# Patient Record
Sex: Female | Born: 1950 | Race: Black or African American | Hispanic: No | Smoking: Never smoker
Health system: Southern US, Community
[De-identification: ages and names within clinical notes are randomized; demographics above are authoritative.]

## PROBLEM LIST (undated history)

## (undated) DIAGNOSIS — K589 Irritable bowel syndrome without diarrhea: Secondary | ICD-10-CM

## (undated) DIAGNOSIS — K5790 Diverticulosis of intestine, part unspecified, without perforation or abscess without bleeding: Secondary | ICD-10-CM

## (undated) DIAGNOSIS — F419 Anxiety disorder, unspecified: Secondary | ICD-10-CM

## (undated) DIAGNOSIS — K219 Gastro-esophageal reflux disease without esophagitis: Secondary | ICD-10-CM

## (undated) DIAGNOSIS — IMO0002 Reserved for concepts with insufficient information to code with codable children: Secondary | ICD-10-CM

## (undated) HISTORY — PX: CARDIAC SURGERY: SHX584

## (undated) HISTORY — PX: ABDOMINAL HYSTERECTOMY: SHX81

## (undated) HISTORY — PX: PARTIAL HYSTERECTOMY: SHX80

## (undated) HISTORY — PX: UMBILICAL HERNIA REPAIR: SHX196

## (undated) HISTORY — DX: Anxiety disorder, unspecified: F41.9

## (undated) HISTORY — DX: Irritable bowel syndrome, unspecified: K58.9

## (undated) HISTORY — PX: OTHER SURGICAL HISTORY: SHX169

## (undated) HISTORY — DX: Reserved for concepts with insufficient information to code with codable children: IMO0002

## (undated) HISTORY — DX: Diverticulosis of intestine, part unspecified, without perforation or abscess without bleeding: K57.90

## (undated) HISTORY — PX: TUBAL LIGATION: SHX77

## (undated) HISTORY — DX: Gastro-esophageal reflux disease without esophagitis: K21.9

---

## 2002-07-04 ENCOUNTER — Ambulatory Visit (HOSPITAL_COMMUNITY): Admission: RE | Admit: 2002-07-04 | Discharge: 2002-07-04 | Payer: Self-pay | Admitting: Internal Medicine

## 2002-07-04 ENCOUNTER — Encounter: Payer: Self-pay | Admitting: Internal Medicine

## 2008-01-16 ENCOUNTER — Ambulatory Visit (HOSPITAL_COMMUNITY): Admission: RE | Admit: 2008-01-16 | Discharge: 2008-01-16 | Payer: Self-pay | Admitting: Family Medicine

## 2008-01-25 ENCOUNTER — Ambulatory Visit: Payer: Self-pay | Admitting: Gastroenterology

## 2008-02-12 ENCOUNTER — Ambulatory Visit: Payer: Self-pay | Admitting: Gastroenterology

## 2008-02-12 ENCOUNTER — Encounter: Payer: Self-pay | Admitting: Gastroenterology

## 2008-02-12 ENCOUNTER — Ambulatory Visit (HOSPITAL_COMMUNITY): Admission: RE | Admit: 2008-02-12 | Discharge: 2008-02-12 | Payer: Self-pay | Admitting: Gastroenterology

## 2008-02-12 HISTORY — PX: ESOPHAGOGASTRODUODENOSCOPY: SHX1529

## 2008-02-12 HISTORY — PX: COLONOSCOPY: SHX174

## 2008-03-28 ENCOUNTER — Ambulatory Visit: Payer: Self-pay | Admitting: Gastroenterology

## 2008-08-22 DIAGNOSIS — K219 Gastro-esophageal reflux disease without esophagitis: Secondary | ICD-10-CM | POA: Insufficient documentation

## 2008-08-22 DIAGNOSIS — R109 Unspecified abdominal pain: Secondary | ICD-10-CM | POA: Insufficient documentation

## 2008-09-24 ENCOUNTER — Ambulatory Visit: Payer: Self-pay | Admitting: Gastroenterology

## 2008-09-24 DIAGNOSIS — M25559 Pain in unspecified hip: Secondary | ICD-10-CM | POA: Insufficient documentation

## 2008-10-22 ENCOUNTER — Ambulatory Visit (HOSPITAL_COMMUNITY): Admission: RE | Admit: 2008-10-22 | Discharge: 2008-10-22 | Payer: Self-pay | Admitting: Family Medicine

## 2009-06-13 ENCOUNTER — Encounter (INDEPENDENT_AMBULATORY_CARE_PROVIDER_SITE_OTHER): Payer: Self-pay | Admitting: *Deleted

## 2009-06-13 LAB — CONVERTED CEMR LAB
ALT: 11 units/L
AST: 18 units/L
Albumin: 4.2 g/dL
Alkaline Phosphatase: 48 units/L
BUN: 12 mg/dL
Bilirubin, Direct: >60 mg/dL
CO2: 26 meq/L
Calcium: 9.2 mg/dL
Chloride: 107 meq/L
Creatinine, Ser: 0.79 mg/dL
Glomerular Filtration Rate, Af Am: >60 mL/min/{1.73_m2}
Glucose, Bld: 87 mg/dL
Potassium: 4 meq/L
Sodium: 142 meq/L
TSH: 1.214 microintl units/mL
Total Protein: 6.7 g/dL

## 2009-06-23 ENCOUNTER — Encounter (INDEPENDENT_AMBULATORY_CARE_PROVIDER_SITE_OTHER): Payer: Self-pay | Admitting: *Deleted

## 2009-09-24 ENCOUNTER — Encounter (INDEPENDENT_AMBULATORY_CARE_PROVIDER_SITE_OTHER): Payer: Self-pay | Admitting: *Deleted

## 2009-11-18 ENCOUNTER — Ambulatory Visit: Payer: Self-pay | Admitting: Gastroenterology

## 2009-11-18 DIAGNOSIS — R634 Abnormal weight loss: Secondary | ICD-10-CM | POA: Insufficient documentation

## 2009-11-18 DIAGNOSIS — K589 Irritable bowel syndrome without diarrhea: Secondary | ICD-10-CM | POA: Insufficient documentation

## 2009-11-18 DIAGNOSIS — R142 Eructation: Secondary | ICD-10-CM

## 2009-11-18 DIAGNOSIS — R1011 Right upper quadrant pain: Secondary | ICD-10-CM | POA: Insufficient documentation

## 2009-11-18 DIAGNOSIS — R143 Flatulence: Secondary | ICD-10-CM

## 2009-11-18 DIAGNOSIS — R630 Anorexia: Secondary | ICD-10-CM | POA: Insufficient documentation

## 2009-11-18 DIAGNOSIS — R141 Gas pain: Secondary | ICD-10-CM | POA: Insufficient documentation

## 2009-11-21 ENCOUNTER — Encounter: Payer: Self-pay | Admitting: Gastroenterology

## 2009-11-21 ENCOUNTER — Ambulatory Visit (HOSPITAL_COMMUNITY): Admission: RE | Admit: 2009-11-21 | Discharge: 2009-11-21 | Payer: Self-pay | Admitting: Gastroenterology

## 2009-11-26 ENCOUNTER — Encounter (INDEPENDENT_AMBULATORY_CARE_PROVIDER_SITE_OTHER): Payer: Self-pay

## 2009-12-15 ENCOUNTER — Telehealth (INDEPENDENT_AMBULATORY_CARE_PROVIDER_SITE_OTHER): Payer: Self-pay

## 2010-01-08 LAB — CONVERTED CEMR LAB
ALT: 12 units/L (ref 0–35)
AST: 19 units/L (ref 0–37)
Albumin: 4 g/dL (ref 3.5–5.2)
Alkaline Phosphatase: 51 units/L (ref 39–117)
BUN: 13 mg/dL (ref 6–23)
Basophils Absolute: 0 10*3/uL (ref 0.0–0.1)
Basophils Relative: 1 % (ref 0–1)
CO2: 26 meq/L (ref 19–32)
Calcium: 9.7 mg/dL (ref 8.4–10.5)
Chloride: 103 meq/L (ref 96–112)
Creatinine, Ser: 0.97 mg/dL (ref 0.40–1.20)
Eosinophils Absolute: 0.1 10*3/uL (ref 0.0–0.7)
Eosinophils Relative: 3 % (ref 0–5)
Glucose, Bld: 90 mg/dL (ref 70–99)
HCT: 41.5 % (ref 36.0–46.0)
Hemoglobin: 13.4 g/dL (ref 12.0–15.0)
Lipase: 29 units/L (ref 0–75)
Lymphocytes Relative: 40 % (ref 12–46)
Lymphs Abs: 1.5 10*3/uL (ref 0.7–4.0)
MCHC: 32.3 g/dL (ref 30.0–36.0)
MCV: 88.7 fL (ref 78.0–100.0)
Monocytes Absolute: 0.3 10*3/uL (ref 0.1–1.0)
Monocytes Relative: 8 % (ref 3–12)
Neutro Abs: 1.8 10*3/uL (ref 1.7–7.7)
Neutrophils Relative %: 48 % (ref 43–77)
Platelets: 195 10*3/uL (ref 150–400)
Potassium: 4.2 meq/L (ref 3.5–5.3)
RBC: 4.68 M/uL (ref 3.87–5.11)
RDW: 13.6 % (ref 11.5–15.5)
Sodium: 142 meq/L (ref 135–145)
Total Bilirubin: 0.6 mg/dL (ref 0.3–1.2)
Total Protein: 6.5 g/dL (ref 6.0–8.3)
WBC: 3.7 10*3/uL — ABNORMAL LOW (ref 4.0–10.5)

## 2010-02-12 ENCOUNTER — Ambulatory Visit: Payer: Self-pay | Admitting: Gastroenterology

## 2010-03-26 ENCOUNTER — Encounter (INDEPENDENT_AMBULATORY_CARE_PROVIDER_SITE_OTHER): Payer: Self-pay | Admitting: *Deleted

## 2010-04-21 ENCOUNTER — Ambulatory Visit (HOSPITAL_COMMUNITY): Admission: RE | Admit: 2010-04-21 | Payer: Self-pay | Admitting: Family Medicine

## 2010-06-13 ENCOUNTER — Encounter: Payer: Self-pay | Admitting: Family Medicine

## 2010-06-23 NOTE — Letter (Signed)
Summary: Recall Office Visit  [facility]  [address]   [place], [place] [address]   Phone: [phone]  Fax: [phone]      March 26, 2010   [facility] [address] [place], [place]  [address] 1950-09-02   Dear Ms. Picinich,   According to our records, it is time for you to schedule a follow-up office visit with [place].   At your convenience, please call [phone] to schedule an office visit. If you have any questions, concerns, or feel that this letter is in error, we would appreciate your call.   Sincerely,    Diana Eves  [facility] Ph: [phone]   Fax: [phone]

## 2010-06-23 NOTE — Assessment & Plan Note (Signed)
Summary: YR FU/ABD PAIN/SS   Visit Type:  f/u Primary Care Provider:  [facility]  Chief Complaint:  abd pain and no appetite.  History of Present Illness: Patient here for one year f/u. H/O GERD/IBS. She c/o ongoing lower abd pain. She is constantly sore. No longer on Hyomax. Last week she started having RUQ abd pain. Pain radiates to back. Intermittent. No n/v. No appetite for several months. In AM, wake up with bitter taste in mouth. PP abd bloating. No heartburn. BM 2-3 times per day. No melena, brbpr. Weight down six pounds. No pp nausea, just no appetite. PCP recently gave her RX for carafate. No improvement of symptoms. Also given omeprazole 40mg  daily. Apparently patient came off med since last time here. She doesn't recall being on it before, but she was on both carafate and omeprazole previously when she had EGD. Denies melena, brbpr.   Current Medications (verified): 1)  Omeprazole 40 Mg Cpdr (Omeprazole) .... Once Daily 2)  Carafate 1 Gm Tabs (Sucralfate) .... Qid 3)  Xanax 0.5 Mg Tabs (Alprazolam) .... At Bedtime  Allergies (verified): 1)  ! Penicillin  Review of Systems      See HPI  Vital Signs:  Patient profile:   60 year old female Height:      66 inches Weight:      160 pounds BMI:     25.92 Temp:     97.9 degrees F oral Pulse rate:   76 / minute BP sitting:   122 / 80  (left arm) Cuff size:   regular  Vitals Entered By: Hendricks Limes LPN (November 18, 2009 9:14 AM)  Physical Exam  General:  Well developed, well nourished, no acute distress. Head:  Normocephalic and atraumatic. Eyes:  sclera nonicteric Mouth:  OP moist Lungs:  Clear throughout to auscultation. Heart:  Regular rate and rhythm; no murmurs, rubs,  or bruits. Abdomen:  Soft. Mild RUQ tenderness. No HSM or masses. No abd bruit or hernia. Normal BS. Extremities:  No clubbing, cyanosis, edema or deformities noted. Neurologic:  Alert and  oriented x4;  grossly normal  neurologically. Skin:  Intact without significant lesions or rashes. Psych:  Alert and cooperative. Normal mood and affect.  Impression & Recommendations:  Problem # 1:  RUQ PAIN (ICD-789.01) RUQ abd pain, anorexia, weight loss, pp abd bloating. Recently started back on PPI and Carafate. Prior h/o gastritis and two 1mm gastric ulcers. No evidence of malignancy or H.Pylori. Currently symptoms may be secondary to refractory GERD, gastritis. Need to consider biliary etiology. No improvement with Carafate, so she was asked to stop. She will increase omeprazole to two times a day for eight weeks. Add Sustenex for bloating. Further recommendations to follow.  Orders: T-CBC w/Diff [phone]) T-Comprehensive Metabolic Panel [phone]) T-Lipase [phone]) Est. Patient Level III (57846) Abd U/S  Problem # 2:  IBS (ICD-564.1)  May be source for pp bloating. BM ok for now. Plan as above.   Orders: Est. Patient Level III ([address])  Patient Instructions: 1)  Have labwork and Abd U/S done. 2)  Begin Sustenex, one by mouth daily for four weeks to help with abdominal bloating. 3)  Increase your omeprazole to two times a day for next two months. New RX given.  4)  Stop Carafate.  5)  The medication list was reviewed and reconciled.  All changed / newly prescribed medications were explained.  A complete medication list was provided to the patient / caregiver. Prescriptions: OMEPRAZOLE 40 MG CPDR (OMEPRAZOLE)  one by mouth 30 mins before breakfast and evening meal for next 60 days. Then go back to once daily  #60 x 1   Entered and Authorized by:   Leanna Battles. Dixon Boos   Signed by:   Leanna Battles Pooja Camuso PA-C on 11/18/2009   Method used:   Print then Give to Patient   RxID:   [id number]

## 2010-06-23 NOTE — Assessment & Plan Note (Signed)
Summary: RUQ pain   Visit Type:  Follow-up Visit Primary Care Provider:  [facility]: Nelva Bush, M.D.  Chief Complaint:  RUQ pain.  History of Present Illness: Feels like she's going to die. So much pain in RUQ and RLQ and posterior right hip. If lay on left side it throbs on right side. Took Sustenex and it improved Sx. Carafate did not help. AVoids food that cause heartburn. Sx: 1-2x/month. Sharp: there all the time. Can keep her awake for the past 2 weeks. BMs: 2x/day: soft. No nausea or vomiting, Pain not related to eating. No problems swallowing. No blod in her stool. No ASA, BC, Goody's. Take Advil: 1-2x/week. No Motrin or Aleve, No EtOH or cigs. No OTC or herbal meds. Not drinking water: 1 bottle a day. Fiber: eats fiber bars once daily. Stress makes RUQ worse, no change with changing positions. JOB: was working 7 days a week: 2nd shift and now working 2 on 3 off, 3 on 2 off (12 hr shifts). No problems with constipation-eats lots of fruit.  Preventive Screening-Counseling & Management  Alcohol-Tobacco     Smoking Status: never  Current Medications (verified): 1)  Omeprazole 40 Mg Cpdr (Omeprazole) .... One By Mouth 30 Mins Before Breakfast and Evening Meal For Next 60 Days. Then Go Back To Once Daily 2)  Xanax 0.5 Mg Tabs (Alprazolam) .... At Bedtime  Allergies (verified): 1)  ! Penicillin  Past History:  Past Medical History: IBS Epigastric pain/GERD LLQ pain **TCS/EGD 2009 Mild AC diverticulosis. SIMPLE ADENOMA. Small internal hemorrhoids. Moderate-sized HH Mild gastritis and duodenitis. Prominent ampulla.   Past Surgical History: Reviewed history from 08/22/2008 and no changes required. Umbilical Hernia Repair Hysterectomy Removal of Thyroid Growth  Family History: No FH of Colon Cancer OR POLYPS  Social History: Divorced. 4 kids: youngest 97 Alcohol Use - no Occupation: Chief Executive Officer goes to work, church, and home. Patient has never smoked.  Smoking  Status:  never  Vital Signs:  Patient profile:   60 year old female Height:      66 inches Weight:      163 pounds BMI:     26.40 Temp:     98.2 degrees F oral Pulse rate:   76 / minute BP sitting:   110 / 70  (left arm) Cuff size:   regular  Vitals Entered By: Cloria Spring LPN (February 12, 2010 3:32 PM)  Physical Exam  General:  Well developed, well nourished, no acute distress. Head:  Normocephalic and atraumatic. Eyes:  PERRL, no icterus. Mouth:  No deformity or lesions. Lungs:  Clear throughout to auscultation. Heart:  Regular rate and rhythm; no murmurs. Abdomen:  Soft, mild TTP in RUQ, nondistended.  Normal bowel sounds. Pos Carnett's sign in RLQ.  Impression & Recommendations:  Problem # 1:  RUQ PAIN (ICD-789.01) 2o to functional abd pain. Add IMP. OPV in 2 mos. RLQ and pos r hip pain 2o to musculoskeletal pain. Consider PT or chiropracter.   CC: PCP  Patient Instructions: 1)  ADD IMIPRAMINE AT BEDTIME. 2)  Side effects: dry mouth, dry eyes, urinary retention, constipation, drowsiness. 3)  Continue fiber and water. 4)  Contiue OMP.  5)  Follow up in 2 mos. 6)  The medication list was reviewed and reconciled.  All changed / newly prescribed medications were explained.  A complete medication list was provided to the patient / caregiver. Prescriptions: IMIPRAMINE HCL 10 MG TABS (IMIPRAMINE HCL) 1 by mouth at bedtime for 3 days then 2  by mouth at bedtime for 3 days then 3 by mouth qhs  #90 x 5   Entered and Authorized by:   West Bali MD   Signed by:   West Bali MD on 02/12/2010   Method used:   Electronically to        [facility]* (retail)       [address]       [place], [place]  [address]       Ph: [phone]       Fax: [phone]   RxID:   [id number]   Appended Document: RUQ pain F/U OPV IS IN THE COMPUTER  Appended Document: Orders Update    Clinical Lists Changes  Orders: Added new Service order of Est.  Patient Level III (72536) - Signed

## 2010-06-23 NOTE — Letter (Signed)
Summary: Recall Office Visit  [facility]  [address]   [place], [place] [address]   Phone: [phone]  Fax: [phone]      Sep 24, 2009   [facility] [address] [place], [place]  [address] 03/01/1951   Dear Ms. Winemiller,   According to our records, it is time for you to schedule a follow-up office visit with [place].   At your convenience, please call [phone] to schedule an office visit. If you have any questions, concerns, or feel that this letter is in error, we would appreciate your call.   Sincerely,    Diana Eves  [facility] Ph: [phone]   Fax: [phone]

## 2010-06-23 NOTE — Assessment & Plan Note (Signed)
Summary: FU OV 4 MO,ABD PAIN,HEMORRHOIDS/AMS   Visit Type:  Follow-up Visit Primary Care Provider:  Earma Reading, NP-C  Chief Complaint:  follow up- still having pain in lower abd.  History of Present Illness: Pt last seen 11/09. Still discomfort in LLQ and to back. Last week it was in left butt cheek and working 12 hour shifts. No rectal bleeding. Appetetie changed.  Everything tastes bitter. Take Xanax priro to bed. No new meds or supplements. Take Omeprazole as needed. No ibuprofen. Only has heartburn if eats the wrong thing: tomatoes, OJ. Lost 5 lbs. BM 2x/day. Pain in Left side better after BMs. Drinks very little water, but eats fiber. Used Hyomax and made pain better.  Current Medications (verified): 1)  Omeprazole 20 Mg Cpdr (Omeprazole) .... As Needed  Allergies (verified): 1)  ! Penicillin  Past History:  Past Medical History:    IBS    Epigastric pain    LLQ pain  Vital Signs:  Patient profile:   60 year old female Height:      66 inches Weight:      166 pounds BMI:     26.89 Temp:     98.0 degrees F oral Pulse rate:   80 / minute BP sitting:   122 / 88  (right arm) Cuff size:   regular  Vitals Entered By: Hendricks Limes LPN (Sep 24, 452 8:53 AM)  Physical Exam  General:  Well developed, well nourished, no acute distress. Head:  Normocephalic and atraumatic. Lungs:  Clear throughout to auscultation. Heart:  Regular rate and rhythm; no murmurs, rubs,  or bruits. Abdomen:  Soft, nontender and nondistended.Normal bowel sounds.  Impression & Recommendations:  Problem # 1:  ABDOMINAL PAIN, CHRONIC (ICD-789.00) Assessment Improved Likely secondary to IBS. Continue Levsin as needed. RPV in 12 mos.  CC: Earma Reading  Problem # 2:  HIP PAIN, LEFT (ICD-719.45) Likely secondary to SI joint inflammation. Ibuprofen sparingly. HEAT three times a day. Shock absorbing shoes.  Appended Document: FU OV 4 MO,ABD PAIN,HEMORRHOIDS/AMS Discussed IBS and management. 6-8 cups  water daily. High fiber diet.  Appended Document: Orders Update-charge    Clinical Lists Changes  Orders: Added new Service order of Est. Patient Level III (09811) - Signed

## 2010-06-23 NOTE — Letter (Signed)
Summary: Normal Results Letter  [facility]  [address]   [place], [place] [address]   Phone: [phone]  Fax: [phone]    November 26, 2009  [facility] [address] [place], [place]  [address] Sep 24, 1950   Dear Krystal Morris,   Our office has been trying to contact you.  We just wanted to inform you that all of your blood tests were normal. You will need a follow up office visit with Dr. Darrick Penna in 6-8 weeks. Please call our office to schedule this. If you have any questions, please call the office at [phone].   Thank you,    Krystal Limes, LPN Cloria Spring, LPN  [facility] Ph: [phone]   Fax: [phone]

## 2010-06-23 NOTE — Progress Notes (Signed)
Summary: phone note/ bitter taste  Phone Note Call from Patient   Caller: Patient Summary of Call: Pt called and said she has been having a bitter taste in her mouth after her meds at night. She takes Omeprazole and Xanax at bedtime, said the bitter taste seems to give her a headache, please advise! Initial call taken by: Cloria Spring LPN,  December 15, 2009 3:02 PM     Appended Document: phone note/ bitter taste Please call pt. She should continue omeprazole to 20 mg 30 minutes prior to meals. She should follow a low fat diet. Use Maalox or Mylanta 20 cc at bedtime.  Appended Document: phone note/ bitter taste Pt informed.

## 2010-06-23 NOTE — Miscellaneous (Signed)
Summary: LABS CMP,TSH ,06/13/2009  Clinical Lists Changes  Observations: Added new observation of CALCIUM: 9.2 mg/dL (81/19/1478 29:56) Added new observation of ALBUMIN: 4.2 g/dL (21/30/8657 84:69) Added new observation of PROTEIN, TOT: 6.7 g/dL (62/95/2841 32:44) Added new observation of SGPT (ALT): 11 units/L (06/13/2009 15:58) Added new observation of SGOT (AST): 18 units/L (06/13/2009 15:58) Added new observation of ALK PHOS: 48 units/L (06/13/2009 15:58) Added new observation of BILI DIRECT: >60 mg/dL (05/26/7251 66:44) Added new observation of GFR AA: >60 mL/min/1.25m2 (06/13/2009 15:58) Added new observation of CREATININE: 0.79 mg/dL (03/47/4259 56:38) Added new observation of BUN: 12 mg/dL (75/64/3329 51:88) Added new observation of BG RANDOM: 87 mg/dL (41/66/0630 16:01) Added new observation of CO2 PLSM/SER: 26 meq/L (06/13/2009 15:58) Added new observation of CL SERUM: 107 meq/L (06/13/2009 15:58) Added new observation of K SERUM: 4.0 meq/L (06/13/2009 15:58) Added new observation of NA: 142 meq/L (06/13/2009 15:58) Added new observation of TSH: 1.214 microintl units/mL (06/13/2009 15:58)

## 2010-09-04 ENCOUNTER — Other Ambulatory Visit (HOSPITAL_COMMUNITY): Payer: Self-pay | Admitting: Internal Medicine

## 2010-09-04 DIAGNOSIS — Z139 Encounter for screening, unspecified: Secondary | ICD-10-CM

## 2010-09-10 ENCOUNTER — Ambulatory Visit (HOSPITAL_COMMUNITY): Payer: Self-pay

## 2010-09-17 ENCOUNTER — Ambulatory Visit (HOSPITAL_COMMUNITY)
Admission: RE | Admit: 2010-09-17 | Discharge: 2010-09-17 | Disposition: A | Discharge status: Home / Self Care | Payer: PRIVATE HEALTH INSURANCE | Source: Ambulatory Visit | Attending: Internal Medicine | Admitting: Internal Medicine

## 2010-09-17 DIAGNOSIS — Z139 Encounter for screening, unspecified: Secondary | ICD-10-CM

## 2010-09-17 DIAGNOSIS — Z1231 Encounter for screening mammogram for malignant neoplasm of breast: Secondary | ICD-10-CM | POA: Insufficient documentation

## 2010-09-23 ENCOUNTER — Ambulatory Visit: Payer: Self-pay | Admitting: Gastroenterology

## 2010-09-28 ENCOUNTER — Encounter: Payer: Self-pay | Admitting: Gastroenterology

## 2010-10-06 NOTE — Assessment & Plan Note (Signed)
NAME:  Krystal Morris, Krystal Morris                CHART#:  [id number]   DATE:  01/25/2008                       DOB:  Aug 18, 1950   REASON FOR CONSULTATION:  Abdominal pain.   PHYSICIAN REQUESTING CONSULTATION:  Patrica Duel, MD   HISTORY OF PRESENT ILLNESS:  The patient is a pleasant 60 year old lady  who presents for further evaluation of chronic abdominal pain.  She has  had this pain for at least over 1 year.  She states her abdomen feels  sore all the time.  She particularly notes lower abdominal pain which  tend to get worse with increased stress or when she goes from a sitting  to a standing position.  She complains of abdominal bloating and  swelling.  She has a bowel movement generally every 2-3 days.  She has  occasional bright red blood per rectum.  She notes her heartburn is  better on omeprazole.  She has no nausea or vomiting.  No fever or  chills.  Her weight is down 6 pounds in the last 2 weeks and she states  this is because she does not feel like eating.  She has intermittent  diarrhea which occurs sporadically.  She had an abdominal ultrasound on  January 16, 2008, which was normal.  She apparently had a CT last year.  We are trying to get those records.  She recalls her last colonoscopy to  be about 6 years ago with Dr. Cleotis Nipper and states it was normal.  Her  last EGD was in the very remote past.  She has had recent blood work.  We trying to get those records as well.   CURRENT MEDICATIONS:  1. Carafate 2 teaspoons before meals and at bedtime.  2. Omeprazole 20 mg daily.  3. Amitriptyline 10-20 mg at bedtime.  4. Ibuprofen p.r.n., but does not take regularly.   ALLERGIES:  Penicillin causes rash.   PAST MEDICAL HISTORY:  She has chronic GERD and chronic abdominal pain  as above.   PAST SURGICAL HISTORY:  Colonoscopy as above, umbilical hernia repair,  and hysterectomy.  She had lysis of adhesions and removal of a thyroid  growth.   FAMILY HISTORY:  Negative for  colorectal cancer.  Her mother and father  both had emphysema, brother deceased due to cancer of ? Back.   SOCIAL HISTORY:  She is separated.  She has 4 children.  She is employed  as a Location manager.  She has never been a smoker.  No alcohol or drug  use.   REVIEW OF SYSTEMS:  See HPI for GI.  Constitutional:  Weight loss as  above.  Cardiopulmonary:  No chest pain or shortness of breath.  Genitourinary:  No dysuria or hematuria.   PHYSICAL EXAMINATION:  VITAL SIGNS:  Weight 172, height 5 feet 7 inches,  temp 98, blood pressure 122/82, and pulse 60.  GENERAL:  A pleasant, well-nourished, well-developed female in no acute  distress.  SKIN:  Warm and dry.  No jaundice.  HEENT:  Sclerae nonicteric.  Oropharyngeal mucosa moist and pink.  No  lesions, erythema, or exudate.  No lymphadenopathy or thyromegaly.  CHEST:  Lungs are clear to auscultation.  Cardiac:  Regular rate and rhythm.  No murmurs, rubs, or gallops.  ABDOMEN:  Positive bowel sounds.  Abdomen is soft and nondistended.  She  has a mild diffuse abdominal tenderness with some increased tenderness  in the hypogastrium.  No rebound or guarding.  No organomegaly or  masses.  No abdominal bruits or hernias.  LOWER EXTREMITIES:  No edema.   IMPRESSION:  The patient is a 60 year old lady with chronic abdominal  pain.  She has significant pain in the lower abdomen worse with stress  and when she stands up from the sitting position.  It tends to be worse  with bowel movements.  She also complains of pain just above the  umbilicus.  Her heartburn is better on omeprazole.  She has intermittent  hematochezia with last colonoscopy about 6 years ago.  Differential  diagnosis for abdominal pain includes adhesions, less likely peptic  ulcer disease, IBS, or functional abdominal pain.   PLAN:  1. Retrieve record regarding last colonoscopy.  2. Retrieve recent labs and CT done about a year ago.  3. Trial of Levsin sublingual q.a.c.,  at bedtime, p.r.n., abdominal      cramping, #60, 1 refill.  4. Further recommendations to follow regarding if she need to have      endoscopy, colonoscopy, etc., done.       Tana Coast, P.A.  Electronically Signed     Kassie Mends, M.D.  Electronically Signed    LL/MEDQ  D:  01/25/2008  T:  01/25/2008  Job:  [id number]   cc:   Patrica Duel, M.D.

## 2010-10-06 NOTE — Assessment & Plan Note (Signed)
NAMESHACONDA, HAJDUK                CHART#:  [id number]   DATE:  03/28/2008                       DOB:  [birth year]   REFERRING Lelania Bia:  Patrica Duel, MD.   PROBLEM LIST:  1. Abdominal pain.  2. Gastroesophageal reflux disease.  3. Umbilical hernia repair.  4. Lysis of adhesions.  5. Thyroidectomy.  6. Hysterectomy.   SUBJECTIVE:  The patient is a 60 year old female who presents as a  return patient visit.  She was doing fairly well until she started  taking ibuprofen on an empty stomach.  She was taking 2 over-the-counter  everyday for bad headaches.  She was complaining of decreased appetite.  She has been put fiber in her diet.  She is sleeping very well.  The  amitriptyline causes mild dry eyes.  She was stressed out, but now she  is better.   MEDICATIONS:  1. Carafate.  2. Omeprazole.  3. Amitriptyline 2 nightly.  4. Ibuprofen as needed.   OBJECTIVE/PHYSICAL EXAM:  VITAL SIGNS:  Weight 171 pounds, height 5 feet  7 inches, temperature 98, blood pressure 124/80, and pulse 80.  GENERAL:  She is in no apparent distress.  Alert and oriented x4.LUNGS:  Clear to auscultation bilaterally.  CARDIOVASCULAR:  Regular rhythm.ABDOMEN:  Bowel sounds are present,  soft, nontender, and nondistended.   ASSESSMENT:  The patient is a 60 year old female who is having  epigastric pain likely secondary to nonsteroidal anti-inflammatory drug  gastritis.  When she had her procedures in September 2009, she had  chronic active gastritis and was asked to avoid anti-inflammatory drugs.  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  Thank you for  allowing me to see the patient in consultation.  My recommendations  follow.   RECOMMENDATIONS:  1. She may stop the Carafate.  She should continue the omeprazole and      amitriptyline.  She should stop the ibuprofen and use Tylenol as      needed for pain.  2. She has a follow up appointment to see me in 4 months.  3. Screening colonoscopy  in 2019.       Kassie Mends, M.D.  Electronically Signed     SM/MEDQ  D:  03/28/2008  T:  03/28/2008  Job:  [id number]   cc:   Patrica Duel, M.D.

## 2010-10-06 NOTE — Op Note (Signed)
NAME:  Krystal Morris, Krystal Morris               ACCOUNT NO.:  [id number]   MEDICAL RECORD NO.:  [id number]          PATIENT TYPE:  AMB   LOCATION:  DAY                           FACILITY:  [facility]   PHYSICIAN:  Kassie Mends, M.D.      DATE OF BIRTH:  [birth year]   DATE OF PROCEDURE:  02/12/2008  DATE OF DISCHARGE:                               OPERATIVE REPORT   REFERRING PHYSICIAN:  Patrica Duel, MD   PROCEDURE:  1. Ileocolonoscopy with cold forceps polypectomy.  2. Esophagogastroduodenoscopy with cold forceps biopsies.   INDICATION FOR EXAMINATION:  Krystal Morris is a 60 year old female who  presented with abdominal pain and rectal bleeding.  She also had  unintentional weight loss.  She reports having multiple psychosocial  stressors.  She lost her mother, father, sister, and a brother within  the last 2 years.  Her abdominal pain can be precipitated by stress and  by changes in position.  It is also associated with constipation, which  she describes as passing 2 hard stools daily. She does not drink water  or eat a lot of fiber.   FINDINGS:  1. Normal terminal ileum, approximately 20 cm visualized.  2. Rare ascending colon diverticulosis.  Small internal hemorrhoids.      Otherwise, no masses, inflammatory changes, or AVM seen.  3. 2-3 mm polyps, which were sessile and seen in the descending colon      and sigmoid colon.  The polyps were removed via cold forceps.  4. Normal esophagus without evidence of Barrett, mass, erosion,      ulceration, or stricture.  5. A 2-3 cm hiatal hernia without evidence of Cameron ulcer.  Patchy      erythema in the mid body and antrum of the stomach.  Biopsies      obtained via cold forceps.  She also had two 1-mm shallow gastric      ulcers seen in the antrum.  6. Patchy erythema in the duodenal bulb.  Biopsies obtained via cold      forceps to evaluate for eosinophilic duodenitis.  7. Prominent ampulla without definite evidence of adenomatous changes.  Otherwise, normal second portion of the duodenum.   DIAGNOSES:  1. Mild ascending colon diverticulosis.  2. Small colon polyps.  3. Small internal hemorrhoids.  4. Moderate-sized hiatal hernia.  5. Mild gastritis and duodenitis.  6. Prominent ampulla.   RECOMMENDATIONS:  1. She should continue the omeprazole daily.  She should avoid aspirin      and antiinflammatory drugs for 14 days.  No ibuprofen until February 28, 2008.  2. No anticoagulation for 5 days.  3. She should avoid gastric irritants and follow a high-fiber diet.      She was given a handout on high-fiber diet, gastritis, ulcers,      constipation, polyps, diverticulosis, and hemorrhoids.  4. Screening colonoscopy in 10 years.  5. She should drink 6-8 cups of water daily.  She should add Benefiber      daily.  6. She has a follow up appointment in 6 weeks with Tana [facility]  for      abdominal pain and constipation.  7. She has had an ultrasound in 2009 and a CT scan in 2008.  Would      consider endoscopic ultrasound to evaluate her ampulla if her      abdominal pain persists.   MEDICATIONS:  1. Demerol 100 mg IV.  2. Versed 6 mg IV.   PROCEDURE TECHNIQUE:  Physical exam was performed.  Informed consent was  obtained from the patient after explaining the benefits, risks, and  alternatives of the procedure.  The patient was connected to the monitor  and placed in left lateral position.  Continuous oxygen was provided by  nasal cannula and IV medicine administered through an indwelling  cannula.  After administration of sedation and rectal exam, the  patient's rectum was intubated and the scope was advanced under direct  visualization to the distal terminal ileum.  The scope was removed  slowly by carefully examining the color, texture, anatomy, and integrity  of the mucosa on the way out.   After the colonoscopy, the patient's esophagus was intubated with a  diagnostic gastroscope and the gastroscope was  advanced under direct  visualization to the second portion of the duodenum.  The scope was  removed slowly by carefully examining the color, texture, anatomy, and  integrity of the mucosa on the way out.  The patient was recovered in  Endoscopy and discharged home in satisfactory condition.   PATH:  Simple adenoma. Chronic active gastritis. Nl duodenum. Continue  omeprazole.      Kassie Mends, M.D.  Electronically Signed     SM/MEDQ  D:  02/12/2008  T:  02/13/2008  Job:  [id number]   cc:   Patrica Duel, M.D.  Fax: [phone]

## 2010-10-07 ENCOUNTER — Ambulatory Visit: Payer: Self-pay | Admitting: Gastroenterology

## 2010-10-15 ENCOUNTER — Ambulatory Visit: Payer: PRIVATE HEALTH INSURANCE | Admitting: Gastroenterology

## 2011-10-04 ENCOUNTER — Other Ambulatory Visit (HOSPITAL_COMMUNITY): Payer: Self-pay | Admitting: Family Medicine

## 2011-10-04 DIAGNOSIS — Z139 Encounter for screening, unspecified: Secondary | ICD-10-CM

## 2011-10-19 ENCOUNTER — Ambulatory Visit (HOSPITAL_COMMUNITY): Payer: PRIVATE HEALTH INSURANCE

## 2011-10-22 ENCOUNTER — Ambulatory Visit (HOSPITAL_COMMUNITY)
Admission: RE | Admit: 2011-10-22 | Discharge: 2011-10-22 | Disposition: A | Discharge status: Home / Self Care | Payer: PRIVATE HEALTH INSURANCE | Source: Ambulatory Visit | Attending: Family Medicine | Admitting: Family Medicine

## 2011-10-22 DIAGNOSIS — Z1231 Encounter for screening mammogram for malignant neoplasm of breast: Secondary | ICD-10-CM | POA: Insufficient documentation

## 2011-10-22 DIAGNOSIS — Z139 Encounter for screening, unspecified: Secondary | ICD-10-CM

## 2011-12-27 ENCOUNTER — Ambulatory Visit (INDEPENDENT_AMBULATORY_CARE_PROVIDER_SITE_OTHER): Payer: PRIVATE HEALTH INSURANCE | Admitting: Urgent Care

## 2011-12-27 ENCOUNTER — Encounter: Payer: Self-pay | Admitting: Urgent Care

## 2011-12-27 ENCOUNTER — Other Ambulatory Visit: Payer: Self-pay | Admitting: Gastroenterology

## 2011-12-27 VITALS — BP 113/65 | HR 80 | Temp 98.3°F | Ht 67.0 in | Wt 158.0 lb

## 2011-12-27 DIAGNOSIS — K921 Melena: Secondary | ICD-10-CM

## 2011-12-27 DIAGNOSIS — K648 Other hemorrhoids: Secondary | ICD-10-CM | POA: Insufficient documentation

## 2011-12-27 MED ORDER — PEG-KCL-NACL-NASULF-NA ASC-C 100 G PO SOLR: 1.0000 | Freq: Once | ORAL | Status: DC

## 2011-12-27 NOTE — Assessment & Plan Note (Addendum)
Krystal Morris is a pleasant 61 y.o. female informant history of intermittent hematochezia and abdominal cramps resolved with defecation. Last colonoscopy has been over 3 years ago, therefore she will need further investigation with colonoscopy to differentiate source of her bleeding. Differentials include diverticular bleeding, benign anorectal source, NSAID-induced colopathy or colorectal carcinoma or Polyp.  I have discussed risks & benefits which include, but are not limited to, bleeding, infection, perforation & drug reaction.  The patient agrees with this plan & written consent will be obtained.    No advil, aleve, goodys, bc powders or anti-inflammatories until colonoscopy

## 2011-12-27 NOTE — Progress Notes (Signed)
Primary Care Physician:  Kirk Ruths, MD Primary Gastroenterologist:  Dr. Jonette Eva  Chief Complaint  Patient presents with  . Rectal Bleeding    HPI:  Krystal Morris is a 61 y.o. female here for further evaluation of rectal bleeding.  For the past 4 months she has had intermittent small to moderate volume burgundy blood noticed mixed in her stools. She has not seen any clots. She has noticed some irritation to her anus, but has not appreciated any hemorrhoids. She has bilateral lower quadrant cramps which are relieved with defecation. Pain 6/10 and radiates to her back. She denies any problems with diarrhea or constipation. She denies any GERD symptoms. She denies any nausea, vomiting, dysphagia or odynophagia. Her appetite has been good. Her weight is stable. She has been taking Advil 1-2 daily for headaches. Last colonoscopy was in 2009 and she had ascending colon diverticulosis and a simple adenoma as well as internal hemorrhoids.  Past Medical History  Diagnosis Date  . Irritable bowel syndrome   . GERD (gastroesophageal reflux disease)     Epigastric pain  . Diverticulosis     Mild AC simple adenoma small interal hemorrhoids moderate-sized HH mild gastritis and duodenitis    Past Surgical History  Procedure Date  . Partial hysterectomy   . Thyroid growth     Removal  . Umbilical hernia repair   . Esophagogastroduodenoscopy 02/12/2008    Fields- Nl duodenum/chronic active gastritis  . Colonoscopy 02/12/2008    Fields-Simple adenoma. Small int hemorrhoids    Current Outpatient Prescriptions  Medication Sig Dispense Refill  . amitriptyline (ELAVIL) 25 MG tablet Take 25 mg by mouth at bedtime.      Marland Kitchen omeprazole (PRILOSEC) 40 MG capsule Take 40 mg by mouth daily.          Allergies as of 12/27/2011 - Review Complete 12/27/2011  Allergen Reaction Noted  . Penicillins      Family History  Problem Relation Age of Onset  . Coronary artery disease Son     History     Social History  . Marital Status: Divorced    Spouse Name: N/A    Number of Children: 3  . Years of Education: N/A   Occupational History  . knitter    Social History Main Topics  . Smoking status: Never Smoker   . Smokeless tobacco: Not on file  . Alcohol Use: No  . Drug Use: No  . Sexually Active: Not on file   Other Topics Concern  . Not on file   Social History Narrative   Lost 1 son, 3 living    Review of Systems: Gen: Denies any fever, chills, sweats, anorexia, fatigue, weakness, malaise, weight loss, and sleep disorder CV: Denies chest pain, angina, palpitations, syncope, orthopnea, PND, peripheral edema, and claudication. Resp: Denies dyspnea at rest, dyspnea with exercise, cough, sputum, wheezing, coughing up blood, and pleurisy. GI: Denies vomiting blood, jaundice, and fecal incontinence GU : Denies urinary burning, blood in urine, urinary frequency, urinary hesitancy, nocturnal urination, and urinary incontinence. MS: Denies joint pain, limitation of movement, and swelling, stiffness, low back pain, extremity pain. Denies muscle weakness, cramps, atrophy.  Derm: Denies rash, itching, dry skin, hives, moles, warts, or unhealing ulcers.  Psych: Denies depression, anxiety, memory loss, suicidal ideation, hallucinations, paranoia, and confusion. Heme: Denies bruising and enlarged lymph nodes. Neuro:  Denies any headaches, dizziness, paresthesias. Endo:  Denies any problems with DM, thyroid, adrenal function.  Physical Exam: BP 113/65  Pulse 80  Temp 98.3 F (36.8 C) (Temporal)  Ht 5\' 7"  (1.702 m)  Wt 158 lb (71.668 kg)  BMI 24.75 kg/m2 No LMP recorded. Patient has had a hysterectomy. General:   Alert,  Well-developed, well-nourished, pleasant and cooperative in NAD Head:  Normocephalic and atraumatic. Eyes:  Sclera clear, no icterus.   Conjunctiva pink. Ears:  Normal auditory acuity. Nose:  No deformity, discharge, or lesions. Mouth:  No deformity or  lesions,oropharynx pink & moist. Neck:  Supple; no masses or thyromegaly. Lungs:  Clear throughout to auscultation.   No wheezes, crackles, or rhonchi. No acute distress. Heart:  Regular rate and rhythm; no murmurs, clicks, rubs,  or gallops. Abdomen:  Normal bowel sounds.  No bruits.  Soft, non-tender and non-distended without masses, hepatosplenomegaly or hernias noted.  No guarding or rebound tenderness.   Rectal:  Deferred. Msk:  Symmetrical without gross deformities. Normal posture. Pulses:  Normal pulses noted. Extremities:  No clubbing or edema. Neurologic:  Alert and  oriented x4;  grossly normal neurologically. Skin:  Intact without significant lesions or rashes. Lymph Nodes:  No significant cervical adenopathy. Psych:  Alert and cooperative. Normal mood and affect.

## 2011-12-27 NOTE — Patient Instructions (Addendum)
Colonoscopy w/ Dr Darrick Penna Call or To ER if severe bleeding No advil, aleve, goodys, bc powders or anti-inflammatories until colonoscopy

## 2011-12-27 NOTE — Progress Notes (Signed)
Faxed to PCP

## 2012-01-19 ENCOUNTER — Encounter (HOSPITAL_COMMUNITY): Payer: Self-pay | Admitting: Pharmacy Technician

## 2012-01-28 ENCOUNTER — Encounter (HOSPITAL_COMMUNITY): Payer: Self-pay | Admitting: *Deleted

## 2012-01-28 ENCOUNTER — Encounter (HOSPITAL_COMMUNITY)
Admission: RE | Disposition: A | Discharge status: Home / Self Care | Payer: Self-pay | Source: Ambulatory Visit | Attending: Gastroenterology

## 2012-01-28 ENCOUNTER — Ambulatory Visit (HOSPITAL_COMMUNITY)
Admission: RE | Admit: 2012-01-28 | Discharge: 2012-01-28 | Disposition: A | Discharge status: Home / Self Care | Payer: PRIVATE HEALTH INSURANCE | Source: Ambulatory Visit | Attending: Gastroenterology | Admitting: Gastroenterology

## 2012-01-28 DIAGNOSIS — K5730 Diverticulosis of large intestine without perforation or abscess without bleeding: Secondary | ICD-10-CM | POA: Insufficient documentation

## 2012-01-28 DIAGNOSIS — K921 Melena: Secondary | ICD-10-CM

## 2012-01-28 DIAGNOSIS — K648 Other hemorrhoids: Secondary | ICD-10-CM

## 2012-01-28 DIAGNOSIS — K625 Hemorrhage of anus and rectum: Secondary | ICD-10-CM

## 2012-01-28 DIAGNOSIS — K644 Residual hemorrhoidal skin tags: Secondary | ICD-10-CM | POA: Insufficient documentation

## 2012-01-28 DIAGNOSIS — Z538 Procedure and treatment not carried out for other reasons: Secondary | ICD-10-CM | POA: Insufficient documentation

## 2012-01-28 HISTORY — PX: COLONOSCOPY: SHX5424

## 2012-01-28 SURGERY — COLONOSCOPY
Anesthesia: Moderate Sedation

## 2012-01-28 MED ORDER — MIDAZOLAM HCL 5 MG/5ML IJ SOLN
INTRAMUSCULAR | Status: AC
  Filled 2012-01-28: qty 10

## 2012-01-28 MED ORDER — STERILE WATER FOR IRRIGATION IR SOLN
Status: DC | PRN
  Administered 2012-01-28: 11:00:00

## 2012-01-28 MED ORDER — MEPERIDINE HCL 100 MG/ML IJ SOLN
INTRAMUSCULAR | Status: AC
  Filled 2012-01-28: qty 2

## 2012-01-28 MED ORDER — MIDAZOLAM HCL 5 MG/5ML IJ SOLN
INTRAMUSCULAR | Status: DC | PRN
  Administered 2012-01-28 (×2): 2 mg via INTRAVENOUS

## 2012-01-28 MED ORDER — SODIUM CHLORIDE 0.45 % IV SOLN
Freq: Once | INTRAVENOUS | Status: AC
  Administered 2012-01-28: 1000 mL via INTRAVENOUS

## 2012-01-28 MED ORDER — MEPERIDINE HCL 100 MG/ML IJ SOLN
INTRAMUSCULAR | Status: DC | PRN
  Administered 2012-01-28: 50 mg via INTRAVENOUS
  Administered 2012-01-28 (×2): 25 mg via INTRAVENOUS

## 2012-01-28 NOTE — H&P (Signed)
  Primary Care Physician:  Kirk Ruths, MD Primary Gastroenterologist:  Dr. Darrick Penna  Pre-Procedure History & Physical: HPI:  Krystal Morris is a 61 y.o. female here for  BRBPR.   Past Medical History  Diagnosis Date  . Irritable bowel syndrome   . GERD (gastroesophageal reflux disease)     Epigastric pain  . Diverticulosis     Mild AC simple adenoma small interal hemorrhoids moderate-sized HH mild gastritis and duodenitis    Past Surgical History  Procedure Date  . Partial hysterectomy   . Thyroid growth     Removal  . Umbilical hernia repair   . Esophagogastroduodenoscopy 02/12/2008    Krystal Morris- Nl duodenum/chronic active gastritis  . Colonoscopy 02/12/2008    Krystal Morris-Simple adenoma. Small int hemorrhoids    Prior to Admission medications   Medication Sig Start Date End Date Taking? Authorizing Provider  ibuprofen (ADVIL,MOTRIN) 200 MG tablet Take 400 mg by mouth as needed. Pain/headache   Yes Historical Provider, MD    Allergies as of 12/27/2011 - Review Complete 12/27/2011  Allergen Reaction Noted  . Penicillins      Family History  Problem Relation Age of Onset  . Coronary artery disease Son     History   Social History  . Marital Status: Divorced    Spouse Name: N/A    Number of Children: 3  . Years of Education: N/A   Occupational History  . knitter    Social History Main Topics  . Smoking status: Never Smoker   . Smokeless tobacco: Not on file  . Alcohol Use: No  . Drug Use: No  . Sexually Active: Not on file   Other Topics Concern  . Not on file   Social History Narrative   Lost 1 son, 3 living    Review of Systems: See HPI, otherwise negative ROS   Physical Exam: BP 129/72  Pulse 94  Temp 97.8 F (36.6 C) (Oral)  Resp 16  SpO2 98% General:   Alert,  pleasant and cooperative in NAD Head:  Normocephalic and atraumatic. Neck:  Supple;  Lungs:  Clear throughout to auscultation.    Heart:  Regular rate and rhythm. Abdomen:   Soft, nontender and nondistended. Normal bowel sounds, without guarding, and without rebound.   Neurologic:  Alert and  oriented x4;  grossly normal neurologically.  Impression/Plan:    BRBPR  PLAN: TCS TODAY

## 2012-01-28 NOTE — Op Note (Signed)
[facility] [address] [place], [address]   COLONOSCOPY PROCEDURE REPORT  PATIENT: Krystal Morris, Krystal Morris  MR#: [id number] BIRTHDATE: Dec 13, 1950 , 61  yrs. old GENDER: Female ENDOSCOPIST: Jonette Eva, MD REFERRED UX:[id number] Regino Schultze, M.D. PROCEDURE DATE:  01/28/2012 PROCEDURE:   INCOMPLETE Colonoscopy DUE TO POOR BOWEL PREP INDICATIONS:rectal bleeding. MEDICATIONS: Demerol 100 mg IV and Versed 4 mg IV  DESCRIPTION OF PROCEDURE:    Physical exam was performed.  Informed consent was obtained from the patient after explaining the benefits, risks, and alternatives to procedure.  The patient was connected to monitor and placed in left lateral position. Continuous oxygen was provided by nasal cannula and IV medicine administered through an indwelling cannula.  After administration of sedation and rectal exam, the patients rectum was intubated and the EC-3890LI ([id number])  colonoscope was advanced under direct visualization to the MID-TRANSVERSE COLON DUE TO FORMED STOOL IN COLON. The scope was removed slowly by carefully examining the color, texture, anatomy, and integrity mucosa on the way out.  The patient was recovered in endoscopy and discharged home in satisfactory condition.       COLON FINDINGS: Formed stool in MID-TRANSVERSE colon Mild diverticulosis was noted in the sigmoid colon.  , and Moderate sized hemorrhoids were found.  PREP QUALITY: poor. CECAL W/D TIME: N/A  COMPLICATIONS: None  ENDOSCOPIC IMPRESSION: 1.   Formed stool in transverse colon 2.   Mild diverticulosis was noted in the sigmoid colon 3.   Moderate sized hemorrhoids 4.    incomplete colonoscopy due to formed stool in colon   RECOMMENDATIONS: 1.  High fiber diet 2.  PREP H QID FOR HEMORRHOIDAL BLEEDING 3.  REPEAT COLONOSCOPY WITHIN THE NEXT MONTH WITH 2 DAY BOWEL PREP       _______________________________ Rosalie DoctorJonette Eva, MD 01/28/2012 11:30 AM     PATIENT  NAME:  Krystal Morris, Krystal Morris MR#: [id number]

## 2012-01-31 ENCOUNTER — Other Ambulatory Visit: Payer: Self-pay | Admitting: Gastroenterology

## 2012-01-31 ENCOUNTER — Telehealth: Payer: Self-pay | Admitting: Gastroenterology

## 2012-01-31 DIAGNOSIS — K921 Melena: Secondary | ICD-10-CM

## 2012-01-31 NOTE — Telephone Encounter (Signed)
Patient has been R/S for her TCS on 10/01 and her Miralax instructions have been mailed to her

## 2012-01-31 NOTE — Telephone Encounter (Signed)
Message copied by Glendora Score on Mon Jan 31, 2012  9:37 AM ------      Message from: [place]      Created: Fri Jan 28, 2012 12:07 PM       rsc tcs for hematochezia withIN THE NEXT MO WITH MIRALAX                  MIRALAX PREP INSTRUCTIONS AS FOLLOWS:            1. START A CLEAR LIQUID DIET AT 6 PM 2 DAYS PRIOR TO YOUR COLONOSCOPY.            2. CONTINUE THE CLEAR LIQUID DIET ON THE DAY BEFORE THE PROCEDURE.            3. START MIRALAX 1 CAP IN 8 OZ OF CLEAR LIQUID AT 12 NOON. REPEAT EVERY HOUR FOR 6 HOURS ([id number]). DRINK 8 OZ(1 CUP) OF CLEAR LIQUID 30 MINUTES AFTER EACH DOSE.            4. USE DULCOLAX PILLS 10 MG AT 3 PM AND 5 PM ON THE DAY BEFORE THE PROCEDURE.            5. NOTHING TO EAT OR DRINK AFTER MIDNIGHT.            6. USE AN ENEMA THE MORNING OF YOUR PROCEDURE.                        ;

## 2012-02-01 ENCOUNTER — Encounter (HOSPITAL_COMMUNITY): Payer: Self-pay | Admitting: Gastroenterology

## 2012-02-07 ENCOUNTER — Encounter (HOSPITAL_COMMUNITY): Payer: Self-pay | Admitting: Pharmacy Technician

## 2012-02-21 MED ORDER — SODIUM CHLORIDE 0.45 % IV SOLN
INTRAVENOUS | Status: DC
  Administered 2012-02-22: 11:00:00 via INTRAVENOUS

## 2012-02-22 ENCOUNTER — Encounter (HOSPITAL_COMMUNITY): Payer: Self-pay | Admitting: *Deleted

## 2012-02-22 ENCOUNTER — Ambulatory Visit (HOSPITAL_COMMUNITY)
Admission: RE | Admit: 2012-02-22 | Discharge: 2012-02-22 | Disposition: A | Discharge status: Home / Self Care | Payer: PRIVATE HEALTH INSURANCE | Source: Ambulatory Visit | Attending: Gastroenterology | Admitting: Gastroenterology

## 2012-02-22 ENCOUNTER — Encounter (HOSPITAL_COMMUNITY)
Admission: RE | Disposition: A | Discharge status: Home / Self Care | Payer: Self-pay | Source: Ambulatory Visit | Attending: Gastroenterology

## 2012-02-22 DIAGNOSIS — K648 Other hemorrhoids: Secondary | ICD-10-CM | POA: Insufficient documentation

## 2012-02-22 DIAGNOSIS — D126 Benign neoplasm of colon, unspecified: Secondary | ICD-10-CM | POA: Insufficient documentation

## 2012-02-22 DIAGNOSIS — K921 Melena: Secondary | ICD-10-CM

## 2012-02-22 DIAGNOSIS — K5730 Diverticulosis of large intestine without perforation or abscess without bleeding: Secondary | ICD-10-CM

## 2012-02-22 DIAGNOSIS — K625 Hemorrhage of anus and rectum: Secondary | ICD-10-CM

## 2012-02-22 HISTORY — PX: COLONOSCOPY: SHX5424

## 2012-02-22 SURGERY — COLONOSCOPY
Anesthesia: Moderate Sedation

## 2012-02-22 MED ORDER — MIDAZOLAM HCL 5 MG/5ML IJ SOLN
INTRAMUSCULAR | Status: AC
  Filled 2012-02-22: qty 10

## 2012-02-22 MED ORDER — MEPERIDINE HCL 100 MG/ML IJ SOLN
INTRAMUSCULAR | Status: DC | PRN
  Administered 2012-02-22 (×3): 25 mg via INTRAVENOUS

## 2012-02-22 MED ORDER — MEPERIDINE HCL 100 MG/ML IJ SOLN
INTRAMUSCULAR | Status: AC
  Filled 2012-02-22: qty 2

## 2012-02-22 MED ORDER — MIDAZOLAM HCL 5 MG/5ML IJ SOLN
INTRAMUSCULAR | Status: DC | PRN
  Administered 2012-02-22: 1 mg via INTRAVENOUS
  Administered 2012-02-22: 2 mg via INTRAVENOUS
  Administered 2012-02-22: 1 mg via INTRAVENOUS
  Administered 2012-02-22: 2 mg via INTRAVENOUS

## 2012-02-22 NOTE — H&P (Addendum)
  Primary Care Physician:  Kirk Ruths, MD Primary Gastroenterologist:  Dr. Darrick Penna  Pre-Procedure History & Physical: HPI:  Krystal Morris is a 61 y.o. female here for  BRBPR/PERSONAL HISTORY OF POLYPS.  Past Medical History  Diagnosis Date  . Irritable bowel syndrome   . GERD (gastroesophageal reflux disease)     Epigastric pain  . Diverticulosis     Mild AC simple adenoma small interal hemorrhoids moderate-sized HH mild gastritis and duodenitis    Past Surgical History  Procedure Date  . Partial hysterectomy   . Thyroid growth     Removal  . Umbilical hernia repair   . Esophagogastroduodenoscopy 02/12/2008    Krystal Morris- Nl duodenum/chronic active gastritis  . Colonoscopy 02/12/2008    Krystal Morris-Simple adenoma. Small int hemorrhoids  . Colonoscopy 01/28/2012    Procedure: COLONOSCOPY;  Surgeon: [place], MD;  Location: [facility];  Service: Endoscopy;  Laterality: N/A;  12:15    Prior to Admission medications   Medication Sig Start Date End Date Taking? Authorizing Provider  ibuprofen (ADVIL,MOTRIN) 200 MG tablet Take 400 mg by mouth as needed. Pain/headache   Yes Historical Provider, MD    Allergies as of 01/31/2012 - Review Complete 01/28/2012  Allergen Reaction Noted  . Penicillins      Family History  Problem Relation Age of Onset  . Coronary artery disease Son     History   Social History  . Marital Status: Divorced    Spouse Name: N/A    Number of Children: 3  . Years of Education: N/A   Occupational History  . knitter    Social History Main Topics  . Smoking status: Never Smoker   . Smokeless tobacco: Not on file  . Alcohol Use: No  . Drug Use: No  . Sexually Active: Not on file   Other Topics Concern  . Not on file   Social History Narrative   Lost 1 son, 3 living    Review of Systems: See HPI, otherwise negative ROS   Physical Exam: BP 146/85  Pulse 84  Temp 98.1 F (36.7 C) (Oral)  Resp 12  Ht 5\' 7"  (1.702 m)  Wt 158  lb (71.668 kg)  BMI 24.75 kg/m2  SpO2 100% General:   Alert,  pleasant and cooperative in NAD Head:  Normocephalic and atraumatic. Neck:  Supple; Lungs:  Clear throughout to auscultation.    Heart:  Regular rate and rhythm. Abdomen:  Soft, nontender and nondistended. Normal bowel sounds, without guarding, and without rebound.   Neurologic:  Alert and  oriented x4;  grossly normal neurologically.  Impression/Plan:     BRBPR  Plan:  1. TCS TODAY

## 2012-02-22 NOTE — Op Note (Signed)
[facility] [address] [place], [address]   COLONOSCOPY PROCEDURE REPORT  PATIENT: Krystal Morris, Krystal Morris  MR#: [id number] BIRTHDATE: April 21, 1951 , 61  yrs. old GENDER: Female ENDOSCOPIST: Jonette Eva, MD REFERRED KG:[id number] Regino Schultze, M.D. PROCEDURE DATE:  02/22/2012 PROCEDURE:   ILEOColonoscopy with biopsy and Hemorrhoidectomy via banding, clips or ligation INDICATIONS:rectal bleeding. MEDICATIONS: Demerol 75 mg IV and Versed 6 mg IV  DESCRIPTION OF PROCEDURE:    Physical exam was performed.  Informed consent was obtained from the patient after explaining the benefits, risks, and alternatives to procedure.  The patient was connected to monitor and placed in left lateral position. Continuous oxygen was provided by nasal cannula and IV medicine administered through an indwelling cannula.  After administration of sedation and rectal exam, the patients rectum was intubated and the EC-3890Li ([id number]) and EG-[id number] ([id number])  colonoscope was advanced under direct visualization to the ileum.  The scope was removed slowly by carefully examining the color, texture, anatomy, and integrity mucosa on the way out.  The patient was recovered in endoscopy and discharged home in satisfactory condition.       COLON FINDINGS: Three sessile polyps ranging between 3-53mm in size were found in the descending colon.  A polypectomy was performed with cold forceps.  , Mild diverticulosis was noted in the sigmoid colon.  , Moderate sized internal hemorrhoids were found.  , and The mucosa appeared normal in the terminal ileum.     10 CM VISUALIZED.  PREP QUALITY: excellent. CECAL W/D TIME: 15.5 minutes  COMPLICATIONS: None  ENDOSCOPIC IMPRESSION: 1.   Three sessile polyps ranging between 3-66mm in size were found in the descending colon; polypectomy was performed with cold forceps 2.   Mild diverticulosis was noted in the sigmoid colon 3.   Moderate sized internal  hemorrhoids-CAUSING RECTAL BLEEDING-S/P HEMORRHOID BANDINGx2   RECOMMENDATIONS: CALL [phone] IF YOU HAVE A FEVER, A LARGE AMOUNT OF BLEEDING, OR DIFFICULTY URINATING.  DRINK WATER TO KEEP URINE LIGHT YELLOW.  USE NAPROXEN OR IBUPROFEN TWICE DAILY FOR RECTAL DISCOMFORT. TYLENOL AS NEED FOR ADDITIONAL PAIN RELIEF.  COLACE TWICE DAILY TO SOFTEN STOOL.  BIOPSY RESULT WILL BE BACK IN 7 DAYS.  FOLLOW A LOW RESIDUE DIET FOR THE NEXT 2-3 WEEKS.  FOLLOW UP OCT 17 AT 1130 AM.  Next colonoscopy in 10 years.       _______________________________ Rosalie DoctorJonette Eva, MD 02/22/2012 12:14 PM     PATIENT NAME:  Krystal Morris, Krystal Morris MR#: [id number]

## 2012-02-24 ENCOUNTER — Encounter (HOSPITAL_COMMUNITY): Payer: Self-pay | Admitting: Gastroenterology

## 2012-02-24 ENCOUNTER — Telehealth: Payer: Self-pay | Admitting: Gastroenterology

## 2012-02-24 NOTE — Progress Notes (Signed)
REVIEWED.  TCS SEP 2013 IH, SIMPLE ADENOMAS

## 2012-02-24 NOTE — Telephone Encounter (Signed)
Please call pt. She had simple adenomas removed from her colon.    CALL [phone] IF YOU HAVE A FEVER, A LARGE AMOUNT OF BLEEDING, OR DIFFICULTY URINATING.  DRINK WATER TO KEEP URINE LIGHT YELLOW.  USE NAPROXEN OR IBUPROFEN TWICE DAILY FOR RECTAL DISCOMFORT. TYLENOL AS NEED FOR ADDITIONAL PAIN RELIEF.  COLACE TWICE DAILY TO SOFTEN STOOL.  FOLLOW A LOW RESIDUE DIET FOR THE NEXT 2-3 WEEKS.   FOLLOW UP OCT 17 AT 1130 AM.  Next colonoscopy in 10 years.

## 2012-03-08 ENCOUNTER — Encounter: Payer: Self-pay | Admitting: Gastroenterology

## 2012-03-09 ENCOUNTER — Ambulatory Visit (INDEPENDENT_AMBULATORY_CARE_PROVIDER_SITE_OTHER): Payer: PRIVATE HEALTH INSURANCE | Admitting: Gastroenterology

## 2012-03-09 ENCOUNTER — Encounter: Payer: Self-pay | Admitting: Gastroenterology

## 2012-03-09 VITALS — BP 132/75 | HR 76 | Temp 97.4°F | Ht 65.0 in | Wt 160.2 lb

## 2012-03-09 DIAGNOSIS — K648 Other hemorrhoids: Secondary | ICD-10-CM

## 2012-03-09 DIAGNOSIS — K921 Melena: Secondary | ICD-10-CM

## 2012-03-09 NOTE — Progress Notes (Signed)
  Subjective:    Patient ID: Krystal Morris, female    DOB: May 18, 1951, 61 y.o.   MRN: [id number]  PCP: [facility]  HPI HEMORRHOIDS HAD BEEN A PROBLEM FOR A YEAR. LAST SEEN OCT 2013-IH BANDINGx2. HAD NEXT DAY A LITTLE BLEEDING. MILD RECTAL PAIN. NO HEAVY BLEEDING. SX COMPLETELY RESOLVED. C/O DECREASED APPETITE-WEIGHT STABLE.   Past Medical History  Diagnosis Date  . Irritable bowel syndrome   . GERD (gastroesophageal reflux disease)     Epigastric pain  . Diverticulosis     Mild AC simple adenoma small interal hemorrhoids moderate-sized HH mild gastritis and duodenitis    Past Surgical History  Procedure Date  . Partial hysterectomy   . Thyroid growth     Removal  . Umbilical hernia repair   . Esophagogastroduodenoscopy 02/12/2008    Fields- Nl duodenum/chronic active gastritis  . Colonoscopy 02/12/2008    Fields-Simple adenoma. Small int hemorrhoids  . Colonoscopy 01/28/2012    Formed stool in MID-TRANSVERSE colon Mild diverticulosis was noted in the sigmoid colon.  , and Moderate sized hemorrhoids were found  . Colonoscopy 02/22/2012    Three sessile polyps ranging between 3-38mm in size were found in the descending colon; polypectomy was performed/Mild diverticulosis was noted in the sigmoid colon Moderate sized internal hemorrhoids-CAUSING RECTAL BLEEDING-S/P HEMORRHOID BANDINGx2   Allergies  Allergen Reactions  . Penicillins     REACTION: Causes a rash    Current Outpatient Prescriptions  Medication Sig Dispense Refill  . ibuprofen (ADVIL,MOTRIN) 200 MG tablet Take 400 mg by mouth as needed. Pain/headache          Review of Systems     Objective:   Physical Exam  Vitals reviewed. Constitutional: She is oriented to person, place, and time. She appears well-nourished. No distress.  Cardiovascular: Normal rate, regular rhythm and normal heart sounds.   Pulmonary/Chest: Effort normal and breath sounds normal. No respiratory distress.  Abdominal: Soft. Bowel sounds are  normal. She exhibits no distension. There is no tenderness.  Neurological: She is alert and oriented to person, place, and time.       NO FOCAL DEFICITS   Psychiatric: She has a normal mood and affect.          Assessment & Plan:

## 2012-03-09 NOTE — Assessment & Plan Note (Signed)
DUE TO IH-S/P BANDINGx2  AVOID CONSTIPATION. HEMORRHOID PRECAUTIONS. OPV IN 6 MOS

## 2012-03-09 NOTE — Patient Instructions (Signed)
FOLLOW UP IN 6 MOS TO 1 YEARS.  Hemorrhoids Hemorrhoids are dilated (enlarged) veins around the rectum. Sometimes clots will form in the veins. This makes them swollen and painful. These are called thrombosed hemorrhoids. Causes of hemorrhoids include:  Constipation.   Straining to have a bowel movement.   HEAVY LIFTING  HOME CARE INSTRUCTIONS  Eat a well balanced diet and drink 6 to 8 glasses of water every day to avoid constipation. You may also use a bulk laxative.   Avoid straining to have bowel movements.   Keep anal area dry and clean.   Do not use a donut shaped pillow or sit on the toilet for long periods. This increases blood pooling and pain.   Move your bowels when your body has the urge; this will require less straining and will decrease pain and pressure.   DO NOT SIT ON THE COMMODE AND READ.

## 2012-03-09 NOTE — Progress Notes (Signed)
Faxed to PCP

## 2012-04-05 NOTE — Progress Notes (Signed)
Reminder in epic to follow up in 6 months to one year °

## 2012-04-26 ENCOUNTER — Encounter (HOSPITAL_COMMUNITY): Payer: Self-pay | Admitting: *Deleted

## 2012-04-26 ENCOUNTER — Emergency Department (HOSPITAL_COMMUNITY)
Admission: EM | Admit: 2012-04-26 | Discharge: 2012-04-26 | Disposition: A | Discharge status: Home / Self Care | Payer: PRIVATE HEALTH INSURANCE | Attending: Emergency Medicine | Admitting: Emergency Medicine

## 2012-04-26 DIAGNOSIS — R1110 Vomiting, unspecified: Secondary | ICD-10-CM | POA: Insufficient documentation

## 2012-04-26 DIAGNOSIS — Z8719 Personal history of other diseases of the digestive system: Secondary | ICD-10-CM | POA: Insufficient documentation

## 2012-04-26 DIAGNOSIS — R42 Dizziness and giddiness: Secondary | ICD-10-CM | POA: Insufficient documentation

## 2012-04-26 LAB — CBC WITH DIFFERENTIAL/PLATELET
Basophils Absolute: 0 10*3/uL (ref 0.0–0.1)
Basophils Relative: 1 % (ref 0–1)
Eosinophils Absolute: 0.1 10*3/uL (ref 0.0–0.7)
Eosinophils Relative: 3 % (ref 0–5)
HCT: 40.6 % (ref 36.0–46.0)
Hemoglobin: 13.7 g/dL (ref 12.0–15.0)
Lymphocytes Relative: 39 % (ref 12–46)
Lymphs Abs: 1.5 10*3/uL (ref 0.7–4.0)
MCH: 30 pg (ref 26.0–34.0)
MCHC: 33.7 g/dL (ref 30.0–36.0)
MCV: 88.8 fL (ref 78.0–100.0)
Monocytes Absolute: 0.3 10*3/uL (ref 0.1–1.0)
Monocytes Relative: 8 % (ref 3–12)
Neutro Abs: 1.9 10*3/uL (ref 1.7–7.7)
Neutrophils Relative %: 49 % (ref 43–77)
Platelets: 209 10*3/uL (ref 150–400)
RBC: 4.57 MIL/uL (ref 3.87–5.11)
RDW: 13.2 % (ref 11.5–15.5)
WBC: 3.8 10*3/uL — ABNORMAL LOW (ref 4.0–10.5)

## 2012-04-26 LAB — BASIC METABOLIC PANEL
BUN: 11 mg/dL (ref 6–23)
CO2: 28 mEq/L (ref 19–32)
Calcium: 9.9 mg/dL (ref 8.4–10.5)
Chloride: 103 mEq/L (ref 96–112)
Creatinine, Ser: 0.94 mg/dL (ref 0.50–1.10)
GFR calc Af Amer: 73 mL/min — ABNORMAL LOW (ref >90)
GFR calc non Af Amer: 63 mL/min — ABNORMAL LOW (ref >90)
Glucose, Bld: 98 mg/dL (ref 70–99)
Potassium: 3.4 mEq/L — ABNORMAL LOW (ref 3.5–5.1)
Sodium: 140 mEq/L (ref 135–145)

## 2012-04-26 MED ORDER — PROMETHAZINE HCL 25 MG PO TABS: 25.0000 mg | ORAL_TABLET | Freq: Four times a day (QID) | ORAL | Status: DC | PRN

## 2012-04-26 MED ORDER — SODIUM CHLORIDE 0.9 % IV BOLUS (SEPSIS)
1000.0000 mL | Freq: Once | INTRAVENOUS | Status: AC
  Administered 2012-04-26: 1000 mL via INTRAVENOUS

## 2012-04-26 MED ORDER — LORAZEPAM 2 MG/ML IJ SOLN
1.0000 mg | Freq: Once | INTRAMUSCULAR | Status: AC
  Administered 2012-04-26: 1 mg via INTRAVENOUS
  Filled 2012-04-26: qty 1

## 2012-04-26 NOTE — ED Notes (Signed)
Dizziness with n/v that started today.  Denies pain.

## 2012-04-26 NOTE — ED Provider Notes (Signed)
History     CSN: [id number]  Arrival date & time 04/26/12  1821   First MD Initiated Contact with Patient 04/26/12 2004      Chief Complaint  Patient presents with  . Dizziness  . n/v     (Consider location/radiation/quality/duration/timing/severity/associated sxs/prior treatment) Patient is a 61 y.o. female presenting with vomiting. The history is provided by the patient (pt complains of vomiting.  pt is being treated for inner ear problems). No language interpreter was used.  Emesis  This is a new problem. The current episode started 1 to 2 hours ago. The problem occurs 2 to 4 times per day. The problem has been resolved. The emesis has an appearance of stomach contents. There has been no fever. Pertinent negatives include no abdominal pain, no chills, no cough, no diarrhea, no fever and no headaches.    Past Medical History  Diagnosis Date  . Irritable bowel syndrome   . GERD (gastroesophageal reflux disease)     Epigastric pain  . Diverticulosis     Mild AC simple adenoma small interal hemorrhoids moderate-sized HH mild gastritis and duodenitis    Past Surgical History  Procedure Date  . Partial hysterectomy   . Thyroid growth     Removal  . Umbilical hernia repair   . Esophagogastroduodenoscopy 02/12/2008    Fields- Nl duodenum/chronic active gastritis  . Colonoscopy 02/12/2008    Fields-Simple adenoma. Small int hemorrhoids  . Colonoscopy 01/28/2012    Formed stool in MID-TRANSVERSE colon Mild diverticulosis was noted in the sigmoid colon.  , and Moderate sized hemorrhoids were found  . Colonoscopy 02/22/2012    Three sessile polyps ranging between 3-65mm in size were found in the descending colon; polypectomy was performed/Mild diverticulosis was noted in the sigmoid colon Moderate sized internal hemorrhoids-CAUSING RECTAL BLEEDING-S/P HEMORRHOID BANDINGx2    Family History  Problem Relation Age of Onset  . Coronary artery disease Son     History  Substance Use  Topics  . Smoking status: Never Smoker   . Smokeless tobacco: Not on file  . Alcohol Use: No    OB History    Grav Para Term Preterm Abortions TAB SAB Ect Mult Living                  Review of Systems  Constitutional: Negative for fever, chills and fatigue.  HENT: Negative for congestion, sinus pressure and ear discharge.   Eyes: Negative for discharge.  Respiratory: Negative for cough.   Cardiovascular: Negative for chest pain.  Gastrointestinal: Positive for vomiting. Negative for abdominal pain and diarrhea.  Genitourinary: Negative for frequency and hematuria.  Musculoskeletal: Negative for back pain.  Skin: Negative for rash.  Neurological: Negative for seizures and headaches.  Hematological: Negative.   Psychiatric/Behavioral: Negative for hallucinations.    Allergies  Penicillins  Home Medications   Current Outpatient Rx  Name  Route  Sig  Dispense  Refill  . MECLIZINE HCL 12.5 MG PO TABS   Oral   Take 12.5 mg by mouth 3 (three) times daily as needed. For vertigo         . IBUPROFEN 200 MG PO TABS   Oral   Take 400 mg by mouth as needed. Pain/headache         . PROMETHAZINE HCL 25 MG PO TABS   Oral   Take 1 tablet (25 mg total) by mouth every 6 (six) hours as needed for nausea.   15 tablet   0  BP 128/77  Pulse 75  Temp 97.8 F (36.6 C) (Oral)  Resp 20  Ht 5\' 7"  (1.702 m)  Wt 159 lb (72.122 kg)  BMI 24.90 kg/m2  SpO2 100%  Physical Exam  Constitutional: She is oriented to person, place, and time. She appears well-developed.  HENT:  Head: Normocephalic and atraumatic.  Eyes: Conjunctivae normal and EOM are normal. No scleral icterus.  Neck: Neck supple. No thyromegaly present.  Cardiovascular: Normal rate and regular rhythm.  Exam reveals no gallop and no friction rub.   No murmur heard. Pulmonary/Chest: No stridor. She has no wheezes. She has no rales. She exhibits no tenderness.  Abdominal: She exhibits no distension. There is no  tenderness. There is no rebound.  Musculoskeletal: Normal range of motion. She exhibits no edema.  Lymphadenopathy:    She has no cervical adenopathy.  Neurological: She is oriented to person, place, and time. Coordination normal.  Skin: No rash noted. No erythema.  Psychiatric: She has a normal mood and affect. Her behavior is normal.    ED Course  Procedures (including critical care time)  Labs Reviewed  CBC WITH DIFFERENTIAL - Abnormal; Notable for the following:    WBC 3.8 (*)     All other components within normal limits  BASIC METABOLIC PANEL - Abnormal; Notable for the following:    Potassium 3.4 (*)     GFR calc non Af Amer 63 (*)     GFR calc Af Amer 73 (*)     All other components within normal limits   No results found.   1. Vomiting    Pt improved with tx   MDM          Benny Lennert, MD 04/26/12 2138

## 2012-04-26 NOTE — ED Notes (Signed)
Seen and treated at [facility] ED with Meclinzine 12.5mg .  Reports took 1 dose around 1600 with no relief.

## 2012-04-26 NOTE — ED Notes (Signed)
Pt c/o dizziness since 5pm and NV that began shortly after. Pt denies any blood in vomit. Denies diarrhea.

## 2012-09-13 ENCOUNTER — Other Ambulatory Visit (HOSPITAL_COMMUNITY): Payer: Self-pay | Admitting: Internal Medicine

## 2012-09-13 DIAGNOSIS — R109 Unspecified abdominal pain: Secondary | ICD-10-CM

## 2012-09-18 ENCOUNTER — Ambulatory Visit (HOSPITAL_COMMUNITY)
Admission: RE | Admit: 2012-09-18 | Discharge: 2012-09-18 | Disposition: A | Discharge status: Home / Self Care | Payer: PRIVATE HEALTH INSURANCE | Source: Ambulatory Visit | Attending: Internal Medicine | Admitting: Internal Medicine

## 2012-09-18 ENCOUNTER — Other Ambulatory Visit (HOSPITAL_COMMUNITY): Payer: Self-pay | Admitting: Internal Medicine

## 2012-09-18 DIAGNOSIS — N949 Unspecified condition associated with female genital organs and menstrual cycle: Secondary | ICD-10-CM | POA: Insufficient documentation

## 2012-09-18 DIAGNOSIS — R109 Unspecified abdominal pain: Secondary | ICD-10-CM

## 2012-10-05 ENCOUNTER — Encounter: Payer: Self-pay | Admitting: Gastroenterology

## 2012-12-06 ENCOUNTER — Other Ambulatory Visit (HOSPITAL_COMMUNITY): Payer: Self-pay | Admitting: Family Medicine

## 2012-12-06 DIAGNOSIS — Z139 Encounter for screening, unspecified: Secondary | ICD-10-CM

## 2012-12-13 ENCOUNTER — Inpatient Hospital Stay (HOSPITAL_COMMUNITY): Admission: RE | Admit: 2012-12-13 | Payer: PRIVATE HEALTH INSURANCE | Source: Ambulatory Visit

## 2013-01-15 ENCOUNTER — Ambulatory Visit (HOSPITAL_COMMUNITY)
Admission: RE | Admit: 2013-01-15 | Discharge: 2013-01-15 | Disposition: A | Discharge status: Home / Self Care | Payer: PRIVATE HEALTH INSURANCE | Source: Ambulatory Visit | Attending: Family Medicine | Admitting: Family Medicine

## 2013-01-15 DIAGNOSIS — Z1231 Encounter for screening mammogram for malignant neoplasm of breast: Secondary | ICD-10-CM | POA: Insufficient documentation

## 2013-01-15 DIAGNOSIS — Z139 Encounter for screening, unspecified: Secondary | ICD-10-CM

## 2013-03-28 ENCOUNTER — Ambulatory Visit: Payer: Self-pay | Admitting: Family Medicine

## 2013-04-02 ENCOUNTER — Ambulatory Visit (HOSPITAL_COMMUNITY)
Admission: RE | Admit: 2013-04-02 | Discharge: 2013-04-02 | Disposition: A | Discharge status: Home / Self Care | Payer: PRIVATE HEALTH INSURANCE | Source: Ambulatory Visit | Attending: Family Medicine | Admitting: Family Medicine

## 2013-04-02 ENCOUNTER — Encounter: Payer: Self-pay | Admitting: Family Medicine

## 2013-04-02 ENCOUNTER — Ambulatory Visit (INDEPENDENT_AMBULATORY_CARE_PROVIDER_SITE_OTHER): Payer: PRIVATE HEALTH INSURANCE | Admitting: Family Medicine

## 2013-04-02 VITALS — BP 118/80 | HR 80 | Temp 98.1°F | Resp 20 | Ht 64.0 in | Wt 163.0 lb

## 2013-04-02 DIAGNOSIS — M25559 Pain in unspecified hip: Secondary | ICD-10-CM

## 2013-04-02 DIAGNOSIS — M25552 Pain in left hip: Secondary | ICD-10-CM

## 2013-04-02 DIAGNOSIS — K219 Gastro-esophageal reflux disease without esophagitis: Secondary | ICD-10-CM

## 2013-04-02 DIAGNOSIS — F411 Generalized anxiety disorder: Secondary | ICD-10-CM

## 2013-04-02 NOTE — Patient Instructions (Signed)
Take the nexium once a day before breakfast Take Extra Strength Tylenol twice a day  Get the xray done Release of Records from [facility] Call in 2 weeks to let me know if nexium is helping Make appt when your labs come in  F/U to be determined based on results

## 2013-04-03 DIAGNOSIS — F411 Generalized anxiety disorder: Secondary | ICD-10-CM | POA: Insufficient documentation

## 2013-04-03 NOTE — Assessment & Plan Note (Signed)
X-ray of the hip to be obtained. I'm concerned for osteoarthritis based on exam. Other possibilities she is muscle skeletal. I will have her take acetaminophen for pain

## 2013-04-03 NOTE — Assessment & Plan Note (Signed)
Continue low-dose amitriptyline

## 2013-04-03 NOTE — Assessment & Plan Note (Signed)
I think her symptoms are related to GERD she does have history of gastritis. I'll have her stop taking nonsteroidal anti-inflammatories. I will start her on Nexium she was given samples from the office for 40 mg daily

## 2013-04-03 NOTE — Progress Notes (Signed)
  Subjective:    Patient ID: Krystal Morris, female    DOB: 1950/11/13, 62 y.o.   MRN: [id number]  HPI Patient here to establish care. Previous PCP [facility].  She's a history of anxiety and insomnia she is currently maintained on low-dose amitriptyline  She complains of pain with swallowing for the past few weeks. She was seen by her previous PCP.she may have allergies therefore was prescribed Zyrtec this did not help. She states that she gets a burning sensation when the food goes down. She denies any belching or bloating. She denies any food getting stuck. She's not tried an anti-acid pill  He also complains of left hip groin pain for the past 2 weeks. No specific injury. She has pain when she is up walking around a lot but the pain is relieved by sitting. She does take some ibuprofen if needed.  She is caught up for colonoscopy she's also had history of EGD which showed gastritis She started had her flu shot  Status post hysterectomy secondary to fibroid tumors  She is still working full time   Review of Systems GEN- denies fatigue, fever, weight loss,weakness, recent illness HEENT- denies eye drainage, change in vision, nasal discharge, CVS- denies chest pain, palpitations RESP- denies SOB, cough, wheeze ABD- denies N/V, change in stools, abd pain GU- denies dysuria, hematuria, dribbling, incontinence MSK- + joint pain, muscle aches, injury Neuro- denies headache, dizziness, syncope, seizure activity      Objective:   Physical Exam GEN- NAD, alert and oriented x3 HEENT- PERRL, EOMI, non injected sclera, pink conjunctiva, MMM, oropharynx clear Neck- Supple, no thyromegaly CVS- RRR, no murmur RESP-CTAB ABD-NABS,soft, NT,ND MSK- Back spine NT, neg SLR, Hip- Good ROM, pain with ER, neg Hip rock, normal gait EXT- No edema Pulses- Radial, DP- 2+ Psych - normal affect and mood        Assessment & Plan:

## 2013-05-14 ENCOUNTER — Ambulatory Visit: Payer: PRIVATE HEALTH INSURANCE | Admitting: Family Medicine

## 2013-06-20 ENCOUNTER — Telehealth: Payer: Self-pay | Admitting: Family Medicine

## 2013-06-20 NOTE — Telephone Encounter (Signed)
Pt came into office wanting paper to be signed today while waiting, I informed her that I will get the form to Dr. Buelah Manis and she will look over the paper work before signing and will call when form is ready and that Dr. Lilian Coma not come out of rioom to sign paper, she handed me the form and asked me to be sure provider sees it and for it to be faxed by Friday.

## 2013-06-20 NOTE — Telephone Encounter (Signed)
Call [phone] Pt is needing to ask you a question about this paper for her job

## 2014-01-31 ENCOUNTER — Ambulatory Visit: Payer: PRIVATE HEALTH INSURANCE | Admitting: Gastroenterology

## 2014-02-01 ENCOUNTER — Ambulatory Visit: Payer: PRIVATE HEALTH INSURANCE | Admitting: Gastroenterology

## 2014-02-28 ENCOUNTER — Encounter (INDEPENDENT_AMBULATORY_CARE_PROVIDER_SITE_OTHER): Payer: Self-pay

## 2014-02-28 ENCOUNTER — Encounter: Payer: Self-pay | Admitting: Gastroenterology

## 2014-02-28 ENCOUNTER — Ambulatory Visit (INDEPENDENT_AMBULATORY_CARE_PROVIDER_SITE_OTHER): Payer: PRIVATE HEALTH INSURANCE | Admitting: Gastroenterology

## 2014-02-28 VITALS — BP 135/76 | HR 84 | Temp 98.1°F | Ht 66.0 in | Wt 160.0 lb

## 2014-02-28 DIAGNOSIS — R1030 Lower abdominal pain, unspecified: Secondary | ICD-10-CM

## 2014-02-28 DIAGNOSIS — R1013 Epigastric pain: Secondary | ICD-10-CM

## 2014-02-28 LAB — BASIC METABOLIC PANEL
BUN: 14 mg/dL (ref 6–23)
CO2: 27 mEq/L (ref 19–32)
Calcium: 9.6 mg/dL (ref 8.4–10.5)
Chloride: 105 mEq/L (ref 96–112)
Creat: 0.95 mg/dL (ref 0.50–1.10)
Glucose, Bld: 87 mg/dL (ref 70–99)
Potassium: 3.8 mEq/L (ref 3.5–5.3)
Sodium: 142 mEq/L (ref 135–145)

## 2014-02-28 MED ORDER — ESOMEPRAZOLE MAGNESIUM 40 MG PO CPDR: 40.0000 mg | DELAYED_RELEASE_CAPSULE | Freq: Every day | ORAL | Status: DC

## 2014-02-28 NOTE — Progress Notes (Signed)
Referring Provider: Alycia Rossetti, MD Primary Care Physician:  Vic Blackbird, MD Primary GI: Dr. Oneida Alar   Chief Complaint  Patient presents with  . Abdominal Pain    HPI:   Pleasant 63 year old female presenting with history of internal hemorrhoids, IBS, GERD. Lower abdominal pain like a band, radiates around lower back. When walking feels like rectum is squishy. About 2 weeks ago had yellow diarrhea. Prior to this had lower abdominal sharp twisting pain. Present for 2-3 months. Pain is daily. Rectal bleeding intermittently. Complains of bad odor from buttocks. Better after bath/shower. Sweaty. Every now and then constipated but not "a whole lot". Hasn't been eating. Sometimes pain better after a BM. Has Miralax on hand if needed but not daily. Early satiety, lack of appetite. Aleve for headaches the past week. Rarely.   Dysphagia/odynophagia with fried foods. Pain associated with eating.   Past Medical History  Diagnosis Date  . Irritable bowel syndrome   . GERD (gastroesophageal reflux disease)     Epigastric pain  . Diverticulosis     Mild AC simple adenoma small interal hemorrhoids moderate-sized HH mild gastritis and duodenitis  . Anxiety   . Ulcer     Past Surgical History  Procedure Laterality Date  . Partial hysterectomy    . Thyroid growth      Removal  . Umbilical hernia repair    . Esophagogastroduodenoscopy  02/12/2008    Fields- Nl duodenum/chronic active gastritis  . Colonoscopy  02/12/2008    Fields-Simple adenoma. Small int hemorrhoids  . Colonoscopy  01/28/2012    Formed stool in MID-TRANSVERSE colon Mild diverticulosis was noted in the sigmoid colon.  , and Moderate sized hemorrhoids were found  . Colonoscopy  02/22/2012    Three sessile polyps ranging between 3-65mm in size were found in the descending colon; polypectomy was performed/Mild diverticulosis was noted in the sigmoid colon Moderate sized internal hemorrhoids-CAUSING RECTAL BLEEDING-S/P  HEMORRHOID BANDINGx2  . Tubal ligation      Current Outpatient Prescriptions  Medication Sig Dispense Refill  . amitriptyline (ELAVIL) 25 MG tablet Take 25 mg by mouth at bedtime.      Marland Kitchen ibuprofen (ADVIL,MOTRIN) 200 MG tablet Take 400 mg by mouth as needed. Pain/headache       No current facility-administered medications for this visit.    Allergies as of 02/28/2014 - Review Complete 02/28/2014  Allergen Reaction Noted  . Penicillins Rash     Family History  Problem Relation Age of Onset  . Coronary artery disease Son   . Cancer Son   . COPD Mother   . COPD Father   . Drug abuse Sister     medication overdose    History   Social History  . Marital Status: Divorced    Spouse Name: N/A    Number of Children: 3  . Years of Education: N/A   Occupational History  . knitter    Social History Main Topics  . Smoking status: Never Smoker   . Smokeless tobacco: None  . Alcohol Use: No  . Drug Use: No  . Sexual Activity: None   Other Topics Concern  . None   Social History Narrative   Lost 1 son, 3 living    Review of Systems: As mentioned in HPI.   Physical Exam: BP 135/76  Pulse 84  Temp(Src) 98.1 F (36.7 C) (Oral)  Ht 5\' 6"  (1.676 m)  Wt 160 lb (72.576 kg)  BMI 25.84 kg/m2 General:  Alert and oriented. No distress noted. Pleasant and cooperative.  Head:  Normocephalic and atraumatic. Eyes:  Conjuctiva clear without scleral icterus.Marland Kitchen Heart:  S1, S2 present without murmurs, rubs, or gallops. Abdomen:  +BS, soft, non-tender and non-distended. No rebound or guarding. No HSM or masses noted. Rectal: small external hemorrhoid tag, no rash or lesions, internal exam without mass or abnormality.  Msk:  Symmetrical without gross deformities. Normal posture. Extremities:  Without edema. Neurologic:  Alert and  oriented x4;  grossly normal neurologically. Skin:  Intact without significant lesions or rashes. Psych:  Alert and cooperative. Normal mood and  affect.

## 2014-02-28 NOTE — Patient Instructions (Signed)
I have sent Nexium into your pharmacy and provided samples. Please take this each morning, 30 minutes before breakfast. If you do not have improvement in your symptoms, we will need to do an upper endoscopy.  I ordered a CT scan due to your abdominal pain. On any given day you feel constipated, take Miralax 1 capful.   Further recommendations to follow!

## 2014-03-01 DIAGNOSIS — R1030 Lower abdominal pain, unspecified: Secondary | ICD-10-CM | POA: Insufficient documentation

## 2014-03-01 DIAGNOSIS — R1013 Epigastric pain: Secondary | ICD-10-CM | POA: Insufficient documentation

## 2014-03-01 NOTE — Assessment & Plan Note (Signed)
New onset lower abdominal pain in setting of known IBS. Intermittent constipation. Take Miralax prn. CT due to new onset symptoms. Colonoscopy up to date as of 2013. Further recommendations to follow.

## 2014-03-01 NOTE — Assessment & Plan Note (Signed)
Early satiety, lack of appetite, dysphagia/odynophagia with fried foods. Start Nexium. If no improvement in next few weeks, needs EGD.

## 2014-03-04 ENCOUNTER — Ambulatory Visit (HOSPITAL_COMMUNITY): Payer: PRIVATE HEALTH INSURANCE

## 2014-03-04 NOTE — Progress Notes (Signed)
cc'ed to pcp °

## 2014-03-08 ENCOUNTER — Ambulatory Visit (HOSPITAL_COMMUNITY)
Admission: RE | Admit: 2014-03-08 | Discharge: 2014-03-08 | Disposition: A | Discharge status: Home / Self Care | Payer: PRIVATE HEALTH INSURANCE | Source: Ambulatory Visit | Attending: Gastroenterology | Admitting: Gastroenterology

## 2014-03-08 DIAGNOSIS — K5730 Diverticulosis of large intestine without perforation or abscess without bleeding: Secondary | ICD-10-CM | POA: Insufficient documentation

## 2014-03-08 DIAGNOSIS — R1030 Lower abdominal pain, unspecified: Secondary | ICD-10-CM

## 2014-03-08 DIAGNOSIS — K8020 Calculus of gallbladder without cholecystitis without obstruction: Secondary | ICD-10-CM | POA: Insufficient documentation

## 2014-03-08 DIAGNOSIS — R109 Unspecified abdominal pain: Secondary | ICD-10-CM | POA: Diagnosis present

## 2014-03-08 DIAGNOSIS — K449 Diaphragmatic hernia without obstruction or gangrene: Secondary | ICD-10-CM | POA: Insufficient documentation

## 2014-03-08 DIAGNOSIS — E278 Other specified disorders of adrenal gland: Secondary | ICD-10-CM | POA: Insufficient documentation

## 2014-03-08 MED ORDER — IOHEXOL 300 MG/ML  SOLN
100.0000 mL | Freq: Once | INTRAMUSCULAR | Status: AC | PRN
  Administered 2014-03-08: 100 mL via INTRAVENOUS

## 2014-03-12 ENCOUNTER — Encounter (HOSPITAL_COMMUNITY): Payer: Self-pay | Admitting: Emergency Medicine

## 2014-03-12 ENCOUNTER — Emergency Department (HOSPITAL_COMMUNITY): Payer: PRIVATE HEALTH INSURANCE

## 2014-03-12 ENCOUNTER — Emergency Department (HOSPITAL_COMMUNITY)
Admission: EM | Admit: 2014-03-12 | Discharge: 2014-03-12 | Disposition: A | Discharge status: Home / Self Care | Payer: PRIVATE HEALTH INSURANCE | Attending: Emergency Medicine | Admitting: Emergency Medicine

## 2014-03-12 DIAGNOSIS — Z880 Allergy status to penicillin: Secondary | ICD-10-CM | POA: Diagnosis not present

## 2014-03-12 DIAGNOSIS — Z872 Personal history of diseases of the skin and subcutaneous tissue: Secondary | ICD-10-CM | POA: Diagnosis not present

## 2014-03-12 DIAGNOSIS — K219 Gastro-esophageal reflux disease without esophagitis: Secondary | ICD-10-CM | POA: Diagnosis not present

## 2014-03-12 DIAGNOSIS — Z791 Long term (current) use of non-steroidal anti-inflammatories (NSAID): Secondary | ICD-10-CM | POA: Insufficient documentation

## 2014-03-12 DIAGNOSIS — R079 Chest pain, unspecified: Secondary | ICD-10-CM | POA: Diagnosis not present

## 2014-03-12 DIAGNOSIS — Z8659 Personal history of other mental and behavioral disorders: Secondary | ICD-10-CM | POA: Diagnosis not present

## 2014-03-12 DIAGNOSIS — Z79899 Other long term (current) drug therapy: Secondary | ICD-10-CM | POA: Insufficient documentation

## 2014-03-12 LAB — COMPREHENSIVE METABOLIC PANEL
ALT: 14 U/L (ref 0–35)
AST: 20 U/L (ref 0–37)
Albumin: 4.1 g/dL (ref 3.5–5.2)
Alkaline Phosphatase: 62 U/L (ref 39–117)
Anion gap: 10 (ref 5–15)
BUN: 16 mg/dL (ref 6–23)
CO2: 29 mEq/L (ref 19–32)
Calcium: 9.7 mg/dL (ref 8.4–10.5)
Chloride: 103 mEq/L (ref 96–112)
Creatinine, Ser: 0.99 mg/dL (ref 0.50–1.10)
GFR calc Af Amer: 68 mL/min — ABNORMAL LOW (ref >90)
GFR calc non Af Amer: 59 mL/min — ABNORMAL LOW (ref >90)
Glucose, Bld: 95 mg/dL (ref 70–99)
Potassium: 4.1 mEq/L (ref 3.7–5.3)
Sodium: 142 mEq/L (ref 137–147)
Total Bilirubin: 0.2 mg/dL — ABNORMAL LOW (ref 0.3–1.2)
Total Protein: 7.1 g/dL (ref 6.0–8.3)

## 2014-03-12 LAB — CBC WITH DIFFERENTIAL/PLATELET
Basophils Absolute: 0 10*3/uL (ref 0.0–0.1)
Basophils Relative: 1 % (ref 0–1)
Eosinophils Absolute: 0.2 10*3/uL (ref 0.0–0.7)
Eosinophils Relative: 4 % (ref 0–5)
HCT: 40.8 % (ref 36.0–46.0)
Hemoglobin: 13.4 g/dL (ref 12.0–15.0)
Lymphocytes Relative: 42 % (ref 12–46)
Lymphs Abs: 1.7 10*3/uL (ref 0.7–4.0)
MCH: 29.4 pg (ref 26.0–34.0)
MCHC: 32.8 g/dL (ref 30.0–36.0)
MCV: 89.5 fL (ref 78.0–100.0)
Monocytes Absolute: 0.3 10*3/uL (ref 0.1–1.0)
Monocytes Relative: 8 % (ref 3–12)
Neutro Abs: 1.8 10*3/uL (ref 1.7–7.7)
Neutrophils Relative %: 45 % (ref 43–77)
Platelets: 203 10*3/uL (ref 150–400)
RBC: 4.56 MIL/uL (ref 3.87–5.11)
RDW: 13.5 % (ref 11.5–15.5)
WBC: 4 10*3/uL (ref 4.0–10.5)

## 2014-03-12 LAB — TROPONIN I: Troponin I: <0.3 ng/mL (ref <0.30)

## 2014-03-12 MED ORDER — IBUPROFEN 400 MG PO TABS
600.0000 mg | ORAL_TABLET | Freq: Once | ORAL | Status: AC
  Administered 2014-03-12: 600 mg via ORAL
  Filled 2014-03-12: qty 2

## 2014-03-12 NOTE — ED Provider Notes (Signed)
CSN: [id number]     Arrival date & time 03/12/14  1811 History   First MD Initiated Contact with Patient 03/12/14 2025     Chief Complaint  Patient presents with  . Chest Pain     (Consider location/radiation/quality/duration/timing/severity/associated sxs/prior Treatment) Patient is a 63 y.o. female presenting with chest pain. The history is provided by the patient.  Chest Pain Associated symptoms: abdominal pain   Associated symptoms: no back pain, no headache, no nausea, no numbness, no shortness of breath, not vomiting and no weakness    patient has had pain in her left chest the last 4 days. It is dull. No change with eating. States it began while she was drinking the contrast for abdominal CT. No shortness of breath. It is worse with palpation it feels as if it is in her breast area. No shortness of breath. No cough. No swelling or legs. She's not had pains at this before. No trauma. No hemoptysis. No nausea vomiting.  Past Medical History  Diagnosis Date  . Irritable bowel syndrome   . GERD (gastroesophageal reflux disease)     Epigastric pain  . Diverticulosis     Mild AC simple adenoma small interal hemorrhoids moderate-sized HH mild gastritis and duodenitis  . Anxiety   . Ulcer    Past Surgical History  Procedure Laterality Date  . Partial hysterectomy    . Thyroid growth      Removal  . Umbilical hernia repair    . Esophagogastroduodenoscopy  02/12/2008    Fields- Nl duodenum/chronic active gastritis  . Colonoscopy  02/12/2008    Fields-Simple adenoma. Small int hemorrhoids  . Colonoscopy  01/28/2012    Formed stool in MID-TRANSVERSE colon Mild diverticulosis was noted in the sigmoid colon.  , and Moderate sized hemorrhoids were found  . Colonoscopy  02/22/2012    Three sessile polyps ranging between 3-19mm in size were found in the descending colon; polypectomy was performed/Mild diverticulosis was noted in the sigmoid colon Moderate sized internal hemorrhoids-CAUSING  RECTAL BLEEDING-S/P HEMORRHOID BANDINGx2  . Tubal ligation    . Abdominal hysterectomy    . Cardiac surgery     Family History  Problem Relation Age of Onset  . Coronary artery disease Son   . Cancer Son   . COPD Mother   . COPD Father   . Drug abuse Sister     medication overdose   History  Substance Use Topics  . Smoking status: Never Smoker   . Smokeless tobacco: Not on file  . Alcohol Use: No   OB History   Grav Para Term Preterm Abortions TAB SAB Ect Mult Living                 Review of Systems  Constitutional: Negative for activity change and appetite change.  Eyes: Negative for pain.  Respiratory: Negative for chest tightness and shortness of breath.   Cardiovascular: Positive for chest pain. Negative for leg swelling.  Gastrointestinal: Positive for abdominal pain. Negative for nausea, vomiting and diarrhea.  Genitourinary: Negative for flank pain.  Musculoskeletal: Negative for back pain and neck stiffness.  Skin: Negative for rash.  Neurological: Negative for weakness, numbness and headaches.  Psychiatric/Behavioral: Negative for behavioral problems.      Allergies  Penicillins  Home Medications   Prior to Admission medications   Medication Sig Start Date End Date Taking? Authorizing Provider  esomeprazole (NEXIUM) 40 MG capsule Take 1 capsule (40 mg total) by mouth daily. Take 30 minutes before  breakfast 02/28/14  Yes Orvil Feil, NP  naproxen sodium (ALEVE) 220 MG tablet Take 220 mg by mouth daily as needed (for headache).   Yes Historical Provider, MD   BP 138/73  Pulse 78  Temp(Src) 98.3 F (36.8 C) (Oral)  Resp 16  Ht 5\' 6"  (1.676 m)  Wt 161 lb (73.029 kg)  BMI 26.00 kg/m2  SpO2 99% Physical Exam  Nursing note and vitals reviewed. Constitutional: She is oriented to person, place, and time. She appears well-developed and well-nourished.  HENT:  Head: Normocephalic and atraumatic.  Eyes: EOM are normal. Pupils are equal, round, and reactive  to light.  Neck: Normal range of motion. Neck supple.  Cardiovascular: Normal rate, regular rhythm and normal heart sounds.   No murmur heard. Pulmonary/Chest: Effort normal and breath sounds normal. No respiratory distress. She has no wheezes. She has no rales. She exhibits tenderness.  Tenderness to left anterior chest wall right below breast. No skin changes. No crepitance or deformity.  Abdominal: Soft. Bowel sounds are normal. She exhibits no distension. There is no tenderness. There is no rebound and no guarding.  Musculoskeletal: Normal range of motion.  Neurological: She is alert and oriented to person, place, and time. No cranial nerve deficit.  Skin: Skin is warm and dry.  Psychiatric: She has a normal mood and affect. Her speech is normal.    ED Course  Procedures (including critical care time) Labs Review Labs Reviewed  COMPREHENSIVE METABOLIC PANEL - Abnormal; Notable for the following:    Total Bilirubin 0.2 (*)    GFR calc non Af Amer 59 (*)    GFR calc Af Amer 68 (*)    All other components within normal limits  CBC WITH DIFFERENTIAL  TROPONIN I    Imaging Review Dg Chest 2 View  03/12/2014   CLINICAL DATA:  Chest pain  EXAM: CHEST  2 VIEW  COMPARISON:  None.  FINDINGS: Cardiomediastinal silhouette is unremarkable. No acute infiltrate or pleural effusion. No pulmonary edema. Bony thorax is unremarkable.  IMPRESSION: No active cardiopulmonary disease.   Electronically Signed   By: Lahoma Crocker M.D.   On: 03/12/2014 19:21     EKG Interpretation   Date/Time:  Tuesday March 12 2014 20:41:28 EDT Ventricular Rate:  72 PR Interval:  175 QRS Duration: 86 QT Interval:  441 QTC Calculation: 483 R Axis:   66 Text Interpretation:  Sinus rhythm Confirmed by Alvino Chapel  MD, Ovid Curd  [phone]) on 03/12/2014 11:42:51 PM      MDM   Final diagnoses:  Chest pain, unspecified chest pain type    Patient with chest pain. EKG reassuring. Enzymes negative. Doubt cardiac  cause. Doubt pulmonary embolism. Began after her CT scan contrast. Will discharge home    Jasper Riling. Alvino Chapel, MD 03/12/14 [phone]

## 2014-03-12 NOTE — Discharge Instructions (Signed)

## 2014-03-12 NOTE — ED Notes (Signed)
Pain lt chest,  ?thinks is in breast ,  No breast d/c,  No sob, Hurts to take deep breath,  Had ct of abd on Friday as OP and says she has had this pain since she drank the contrast.

## 2014-03-12 NOTE — ED Notes (Signed)
Discharge instructions given, pt demonstrated teach back and verbal understanding. No concerns voiced.  

## 2014-03-20 NOTE — Progress Notes (Signed)
Quick Note:  LMOM to call. ______ 

## 2014-03-20 NOTE — Progress Notes (Signed)
Quick Note:  Possible tiny gallstones in gallbladder. No evidence of abnormality. She has some stool in her rectum. No evidence of lesions in perirectal area. Nothing to explain her symptoms. ______

## 2014-03-21 NOTE — Progress Notes (Signed)
Quick Note:  LMOM and mailed letter for pt to call for results. ______

## 2014-03-27 ENCOUNTER — Telehealth: Payer: Self-pay

## 2014-03-27 NOTE — Telephone Encounter (Signed)
Pt called for CT results and was informed. She said she is still having a lot of trouble after she sits for awhile, then stands, it feels like a knife going through her back.  She asked if the tiny gall stones could be causing that and I told her that Vicente Males said that it would not. She wants to know what other recommendations that Vicente Males would have for her.

## 2014-03-28 ENCOUNTER — Telehealth: Payer: Self-pay

## 2014-03-28 NOTE — Telephone Encounter (Signed)
[phone] PATIENT RETURNING  CALL FROM YESTERDAY   PLEASE CALL BACK

## 2014-03-28 NOTE — Telephone Encounter (Signed)
Pt called back today to see if Vicente Males had made any recommendations.  She said she is doing better, she just wants to know when she is supposed to have a follow up visit.

## 2014-03-28 NOTE — Telephone Encounter (Signed)
See note of 03/27/2014.

## 2014-03-29 NOTE — Telephone Encounter (Signed)
Does not sound biliary in etiology. IF she is having pain in lower back and it is related to movement, likely musculoskeletal. Would recommended seeing PCP and possibly further imaging of spine.

## 2014-04-01 NOTE — Telephone Encounter (Signed)
LMOM to call.

## 2014-04-02 NOTE — Telephone Encounter (Signed)
Called and informed pt.  

## 2014-05-27 ENCOUNTER — Other Ambulatory Visit (HOSPITAL_COMMUNITY): Payer: Self-pay | Admitting: Internal Medicine

## 2014-05-27 DIAGNOSIS — Z1231 Encounter for screening mammogram for malignant neoplasm of breast: Secondary | ICD-10-CM

## 2014-06-05 ENCOUNTER — Ambulatory Visit (HOSPITAL_COMMUNITY)
Admission: RE | Admit: 2014-06-05 | Discharge: 2014-06-05 | Disposition: A | Discharge status: Home / Self Care | Payer: PRIVATE HEALTH INSURANCE | Source: Ambulatory Visit | Attending: Internal Medicine | Admitting: Internal Medicine

## 2014-06-05 DIAGNOSIS — Z1231 Encounter for screening mammogram for malignant neoplasm of breast: Secondary | ICD-10-CM | POA: Insufficient documentation

## 2014-11-26 ENCOUNTER — Telehealth: Payer: Self-pay | Admitting: Family Medicine

## 2014-12-05 ENCOUNTER — Telehealth: Payer: Self-pay | Admitting: General Practice

## 2014-12-05 NOTE — Telephone Encounter (Signed)
Patient called in complaining of lower abd pain.  She has noticed some dark colored blood in the toilet.  She denies fever, nausea and vomiting.  She did have an episode about two weeks ago of vomiting and diarrhea and felt like she was going to pass out.    She has lost some weight, because she can't eat.  I made her an appt 8/11 at 11:00 am  Routing to [facility] to see if it's okay for patient to wait until 8/11.

## 2014-12-05 NOTE — Telephone Encounter (Signed)
I returned call at the phone number listed and was informed it is not pt's phone number. I read it back off and it was the same number. I called her daughter's emergency contact number @ [phone] and it was busy.

## 2014-12-06 NOTE — Telephone Encounter (Signed)
I called the other daughter's phone number, Donna Bernard at [phone] and [facility] for a return call.

## 2014-12-06 NOTE — Telephone Encounter (Signed)
Willette Cluster returned my call and gave me her mom's phone number of [phone]  And said she has been sick for a awhile but she is just trying to deal with it. I have entered the new phone number.

## 2014-12-06 NOTE — Telephone Encounter (Signed)
I tried the phone number listed again, [phone] and the person told me he does not know this person. He has had this number for about 5 years. I told him I would take it off of her chart so we will not be bothering him. I called her daughter again and the number is busy again also.

## 2014-12-06 NOTE — Telephone Encounter (Signed)
LMOM for a return call from the pt.

## 2014-12-09 NOTE — Telephone Encounter (Signed)
If there is any concern for melena, she needs to be seen sooner than Aug 11. Would recommend in next few days.

## 2014-12-09 NOTE — Telephone Encounter (Signed)
Tried to call again and could not leave a message.

## 2014-12-09 NOTE — Telephone Encounter (Signed)
Pt left Vm for me to call and I tried to call her back and did not get a VM.

## 2014-12-09 NOTE — Telephone Encounter (Signed)
Pt returned call. She said she is OK to wait til her APPT  In August. She is not having n/v now, that was a couple of weeks ago. She does have some abdominal pain, but after she has a BM the abdominal pain feels better. Said she still has some dark stools. She is not having any dizziness or light headedness. Said she has to get  New PCP. Used to see Dr. Buelah Manis and they had a problem with her bill.  Please advise!

## 2014-12-10 NOTE — Telephone Encounter (Signed)
LMOM for pt to return call right away.

## 2014-12-10 NOTE — Telephone Encounter (Signed)
Tried to call pt and could not get connection.

## 2014-12-11 NOTE — Telephone Encounter (Signed)
Several attempts have been made to contact patient. This encounter will be closed.  

## 2014-12-12 NOTE — Telephone Encounter (Signed)
Tried to call. Many rings and then busy signal.

## 2014-12-16 NOTE — Telephone Encounter (Signed)
LMOM to call and also mailed a letter for pt to call.

## 2015-01-02 ENCOUNTER — Ambulatory Visit (INDEPENDENT_AMBULATORY_CARE_PROVIDER_SITE_OTHER): Payer: PRIVATE HEALTH INSURANCE | Admitting: Gastroenterology

## 2015-01-02 ENCOUNTER — Encounter: Payer: Self-pay | Admitting: Gastroenterology

## 2015-01-02 VITALS — BP 135/75 | HR 73 | Temp 97.7°F | Ht 66.0 in | Wt 168.0 lb

## 2015-01-02 DIAGNOSIS — R1013 Epigastric pain: Secondary | ICD-10-CM | POA: Insufficient documentation

## 2015-01-02 DIAGNOSIS — R1310 Dysphagia, unspecified: Secondary | ICD-10-CM | POA: Diagnosis not present

## 2015-01-02 DIAGNOSIS — R1030 Lower abdominal pain, unspecified: Secondary | ICD-10-CM

## 2015-01-02 DIAGNOSIS — K921 Melena: Secondary | ICD-10-CM | POA: Diagnosis not present

## 2015-01-02 MED ORDER — HYDROCORTISONE ACETATE 25 MG RE SUPP: 25.0000 mg | Freq: Two times a day (BID) | RECTAL | Status: DC

## 2015-01-02 MED ORDER — DEXLANSOPRAZOLE 60 MG PO CPDR: 60.0000 mg | DELAYED_RELEASE_CAPSULE | Freq: Every day | ORAL | Status: DC

## 2015-01-02 NOTE — Progress Notes (Signed)
Primary Care Physician: Rosita Fire, MD  Primary Gastroenterologist:  Barney Drain, MD   Chief Complaint  Patient presents with  . Abdominal Pain    and dark stools     HPI: Krystal Morris is a 64 y.o. female here for further evaluation of abdominal pain and blood in the stools. Last seen in October 2015. She has a history of internal hemorrhoids status post banding in 2013, IBS, GERD. History of lower abdominal pain. CT October 2015 with contrast showed questionable tiny gallstones within the gallbladder, sizable hiatal hernia, rectum mildly distended with stool. Unremarkable CBC, LFTs at the time.  Patient was started on Nexium at last office visit. She states her prescription ran out and she's been off of this for several months. Has not been on Aleve and quite some time. Denies any NSAID or aspirin use. Complains of lower abdominal pain which radiates into the left buttocks and down both thighs. Lower abdominal discomfort feels like soreness all the time but sometimes worse. The more intense lower abdominal pain does improve after a bowel movement. Typically has bowel movements 3 times per day. She notices dark red blood in the stool almost every time. This is been going on for a few months. Rarely has constipation. Has been having some upper abdominal pain, burning. Complains of odynophagia with solid foods. Denies dysphagia. No nausea or vomiting or unintentional weight loss. Denies exertional chest pain. No dysuria.   Current Outpatient Prescriptions  Medication Sig Dispense Refill  . acetaminophen (TYLENOL) 325 MG tablet Take 650 mg by mouth every 6 (six) hours as needed.     No current facility-administered medications for this visit.    Allergies as of 01/02/2015 - Review Complete 01/02/2015  Allergen Reaction Noted  . Penicillins Rash    Past Medical History  Diagnosis Date  . Irritable bowel syndrome   . GERD (gastroesophageal reflux disease)     Epigastric pain   . Diverticulosis     Mild AC simple adenoma small interal hemorrhoids moderate-sized HH mild gastritis and duodenitis  . Anxiety   . Ulcer    Past Surgical History  Procedure Laterality Date  . Partial hysterectomy    . Thyroid growth      Removal  . Umbilical hernia repair    . Esophagogastroduodenoscopy  02/12/2008    Fields- Nl duodenum/chronic active gastritis  . Colonoscopy  02/12/2008    Fields-Simple adenoma. Small int hemorrhoids  . Colonoscopy  01/28/2012    Formed stool in MID-TRANSVERSE colon Mild diverticulosis was noted in the sigmoid colon.  , and Moderate sized hemorrhoids were found  . Colonoscopy  02/22/2012    Three sessile polyps ranging between 3-52mm in size were found in the descending colon; polypectomy was performed/Mild diverticulosis was noted in the sigmoid colon Moderate sized internal hemorrhoids-CAUSING RECTAL BLEEDING-S/P HEMORRHOID BANDINGx2  . Tubal ligation    . Abdominal hysterectomy    . Cardiac surgery     Family History  Problem Relation Age of Onset  . Coronary artery disease Son   . Cancer Son   . COPD Mother   . COPD Father   . Drug abuse Sister     medication overdose     Social History   Social History  . Marital Status: Divorced    Spouse Name: N/A  . Number of Children: 3  . Years of Education: N/A   Occupational History  . knitter    Social History Main Topics  .  Smoking status: Never Smoker   . Smokeless tobacco: None     Comment: Never smoked  . Alcohol Use: No  . Drug Use: No  . Sexual Activity: Not Asked   Other Topics Concern  . None   Social History Narrative   Lost 1 son, 3 living    ROS:  General: Negative for anorexia, weight loss, fever, chills, fatigue, weakness. ENT: Negative for hoarseness, difficulty swallowing , nasal congestion. CV: Negative for chest pain, angina, palpitations, dyspnea on exertion, peripheral edema.  Respiratory: Negative for dyspnea at rest, dyspnea on exertion, cough,  sputum, wheezing.  GI: See history of present illness. GU:  Negative for dysuria, hematuria, urinary incontinence, urinary frequency, nocturnal urination.  Endo: Negative for unusual weight change.    Physical Examination:   BP 135/75 mmHg  Pulse 73  Temp(Src) 97.7 F (36.5 C) (Oral)  Ht 5\' 6"  (1.676 m)  Wt 168 lb (76.204 kg)  BMI 27.13 kg/m2  General: Well-nourished, well-developed in no acute distress.  Eyes: No icterus. Mouth: Oropharyngeal mucosa moist and pink , no lesions erythema or exudate. Lungs: Clear to auscultation bilaterally.  Heart: Regular rate and rhythm, no murmurs rubs or gallops.  Abdomen: Bowel sounds are normal, mild diffuse tenderness, nondistended, no hepatosplenomegaly or masses, no abdominal bruits or hernia , no rebound or guarding.   Extremities: No lower extremity edema. No clubbing or deformities. Neuro: Alert and oriented x 4   Skin: Warm and dry, no jaundice.   Psych: Alert and cooperative, normal mood and affect.  Labs:  Lab Results  Component Value Date   WBC 4.0 03/12/2014   HGB 13.4 03/12/2014   HCT 40.8 03/12/2014   MCV 89.5 03/12/2014   PLT 203 03/12/2014   Lab Results  Component Value Date   CREATININE 0.99 03/12/2014   BUN 16 03/12/2014   NA 142 03/12/2014   K 4.1 03/12/2014   CL 103 03/12/2014   CO2 29 03/12/2014   Lab Results  Component Value Date   ALT 14 03/12/2014   AST 20 03/12/2014   ALKPHOS 62 03/12/2014   BILITOT 0.2* 03/12/2014    Imaging Studies: No results found.  CT A/P in 102/2015 IMPRESSION: Sizable hiatal hernia.  Question tiny gallstones within the gallbladder.  Uterus absent. There is no pelvic mass. The rectum is mildly distended with stool. There is no perirectal lesions seen on this study. There are a few sigmoid diverticula without diverticulitis.  Appendix appears normal. No bowel obstruction. No abscess.  Stable left adrenal hypertrophy.

## 2015-01-02 NOTE — Assessment & Plan Note (Signed)
Several month history of lower abdominal pain with radiation into thighs and left buttocks. Persistent pain with some improvement with BM. Unclear etiology. No known lumbar spine history. CT last year as outlined. Complains of rectal bleeding with BMs. Last TCS 2013 with hemorrhoid banding.   Trial of Anusol for ?hemorrhoids.  To discuss pain and further management with Dr. Oneida Alar.

## 2015-01-02 NOTE — Patient Instructions (Signed)
1. Start Dexilant one capsule before breakfast daily for your stomach. Samples and RX sent to your pharmacy. 2. Start Anusol suppository, one per rectum twice daily for 12 days.  3. I will discuss your symptoms with Dr. Oneida Alar, further recommendations to follow.

## 2015-01-02 NOTE — Progress Notes (Signed)
CC'ED TO PCP 

## 2015-01-02 NOTE — Assessment & Plan Note (Signed)
64 year old lady with postprandial upper abdominal pain/odynophagia. Denies NSAID or aspirin use. Symptoms present for couple of months. Came off of PPI at some point since her last office visit. History of likely cholelithiasis based on last CT scan. Doubt biliary based on symptoms. Suspect more likely related to GERD/gastritis. Add DExilant 60mg  daily. To discuss further with Dr. Oneida Alar. Last EGD 2009.

## 2015-01-24 ENCOUNTER — Telehealth: Payer: Self-pay | Admitting: Gastroenterology

## 2015-01-24 NOTE — Telephone Encounter (Signed)
Please arrange for follow up OV with Dr. Oneida Alar, nonurgent. Dx: upper abd pain, rectal bleeding, h/o prominent ampulla/gallstones.

## 2015-01-28 NOTE — Telephone Encounter (Signed)
Tried to call pt. Many rings and no answer.  Routing to [place] to scheduled the OV appt.

## 2015-01-28 NOTE — Telephone Encounter (Signed)
Routing to [place] to schedule.  

## 2015-01-29 ENCOUNTER — Encounter: Payer: Self-pay | Admitting: Gastroenterology

## 2015-01-29 NOTE — Telephone Encounter (Signed)
APPT MADE AND LETTER SENT  °

## 2015-01-29 NOTE — Progress Notes (Signed)
REVIEWED-NO ADDITIONAL RECOMMENDATIONS. 

## 2015-01-29 NOTE — Telephone Encounter (Signed)
OFFER PT OPV SEP 8 AT 1130 OR SEP 13 AT 130P.

## 2015-01-30 ENCOUNTER — Ambulatory Visit (INDEPENDENT_AMBULATORY_CARE_PROVIDER_SITE_OTHER): Payer: PRIVATE HEALTH INSURANCE | Admitting: Gastroenterology

## 2015-01-30 ENCOUNTER — Encounter: Payer: Self-pay | Admitting: Gastroenterology

## 2015-01-30 ENCOUNTER — Ambulatory Visit: Payer: PRIVATE HEALTH INSURANCE | Admitting: Gastroenterology

## 2015-01-30 VITALS — BP 132/70 | HR 75 | Temp 98.0°F | Ht 66.0 in | Wt 166.6 lb

## 2015-01-30 DIAGNOSIS — K648 Other hemorrhoids: Secondary | ICD-10-CM | POA: Diagnosis not present

## 2015-01-30 DIAGNOSIS — K219 Gastro-esophageal reflux disease without esophagitis: Secondary | ICD-10-CM | POA: Diagnosis not present

## 2015-01-30 NOTE — Progress Notes (Signed)
cc'ed to pcp °

## 2015-01-30 NOTE — Progress Notes (Signed)
Subjective:    Patient ID: Krystal Morris, female    DOB: [birth year], 64 y.o.   MRN: [id number]  Rosita Fire, MD  HPI LAST VISIT AUG 2016. C/o peLvic and lower abdominal pain. Feels like a cycle or labor pains. MADE APPT TO SEE GYN. LAST PELVIC EXAM LAST YEAR.  BMs: TWICE A DAY(HARD TO SOFT) DEPENDS ON WHAT SHE EATS YESTERDAY SHE WAS CONSTIPATED. CONSTIPATION 3-4 TIMES A MONTH. DRINKING WATER 2 BOTTLES A DAY. FIBER:  NOT REALLY. WAS HAVING PAIN WITH SWALLOWING BUT DEXILANT DID MAKE HER FEEL BETTER. NOT HAVING PROBLEMS WITH PAINFUL SWALLOWING NOW. HAD BEEN OF MEDS FOR AT LEAST. DEXILANT COSTS TOO MUCH. HEARTBURN: CONTROLLED.  RECTAL BLEEDING: IN THE STOOL/NOT WHEN SHE WIPES OR IN THE WATER. RECTAL PRESSURE, PAIN, ITCHING, BURNING, OR SOILING. HAD HYSTERECTOMY OR VAGINAL DISCHARGE. NO URINARY PROBLEMS USUALLY BUT OCCASIONALLY LOWER ABD PAIN RADIATING TO LEFT BUTT CHEEK AND DOWN LEFT LEG. NO BACK PAIN OR WEAKNESS IN HER LEGS.  PT DENIES FEVER, CHILLS, HEMATEMESIS, nausea, vomiting, melena, diarrhea, CHEST PAIN, SHORTNESS OF BREATH,  constipation, problems swallowing, problems with sedation, OR heartburn or indigestion.  Past Medical History  Diagnosis Date  . Irritable bowel syndrome   . GERD (gastroesophageal reflux disease)     Epigastric pain  . Diverticulosis     Mild AC simple adenoma small interal hemorrhoids moderate-sized HH mild gastritis and duodenitis  . Anxiety   . Ulcer    Past Surgical History  Procedure Laterality Date  . Partial hysterectomy    . Thyroid growth      Removal  . Umbilical hernia repair    . Esophagogastroduodenoscopy  02/12/2008    Khushi Zupko- Nl duodenum/chronic active gastritis  . Colonoscopy  02/12/2008    Blessin Kanno-Simple adenoma. Small int hemorrhoids  . Colonoscopy  01/28/2012    Formed stool in MID-TRANSVERSE colon Mild diverticulosis was noted in the sigmoid colon.  , and Moderate sized hemorrhoids were found  . Colonoscopy  02/22/2012    Three  sessile polyps ranging between 3-48mm in size were found in the descending colon; polypectomy was performed/Mild diverticulosis was noted in the sigmoid colon Moderate sized internal hemorrhoids-CAUSING RECTAL BLEEDING-S/P HEMORRHOID BANDINGx2  . Tubal ligation    . Abdominal hysterectomy    . Cardiac surgery     Allergies  Allergen Reactions  . Penicillins Rash   Current Outpatient Prescriptions  Medication Sig Dispense Refill  . acetaminophen (TYLENOL) 325 MG tablet Take 650 mg by mouth every 6 (six) hours as needed.    Marland Kitchen dexlansoprazole (DEXILANT) 60 MG capsule Take 1 capsule (60 mg total) by mouth daily.    . hydrocortisone (ANUSOL-HC) 25 MG suppository Place 1 suppository (25 mg total) rectally every 12 (twelve) hours.     Review of Systems PER HPI OTHERWISE ALL SYSTEMS ARE NEGATIVE.      Objective:   Physical Exam  Constitutional: She is oriented to person, place, and time. She appears well-developed and well-nourished. No distress.  HENT:  Head: Normocephalic and atraumatic.  Mouth/Throat: Oropharynx is clear and moist. No oropharyngeal exudate.  Eyes: Pupils are equal, round, and reactive to light. No scleral icterus.  Neck: Normal range of motion. Neck supple.  Cardiovascular: Normal rate, regular rhythm and normal heart sounds.   Pulmonary/Chest: Effort normal and breath sounds normal. No respiratory distress.  Abdominal: Soft. Bowel sounds are normal. She exhibits no distension. There is no tenderness.  Musculoskeletal: She exhibits no edema.  Lymphadenopathy:    She has no cervical  adenopathy.  Neurological: She is alert and oriented to person, place, and time.  Psychiatric: She has a normal mood and affect.  Vitals reviewed.         Assessment & Plan:

## 2015-01-30 NOTE — Progress Notes (Signed)
ON RECALL  °

## 2015-01-30 NOTE — Telephone Encounter (Signed)
Patient coming 01/30/15 11:30am

## 2015-01-30 NOTE — Assessment & Plan Note (Signed)
RECORDS REVIEWED FROM 2009 TO PRESENT. SYMPTOMS NOT CONTROLLED.  DRINK WATER TO KEEP YOUR URINE LIGHT YELLOW. FOLLOW A HIGH FIBER/LOW FAT DIET. MEATS SHOULD BE BAKED, BROILED, OR BOILED. AVOID FRIED FOODS.  IF YOU DON'T EAT FIBER, USE BENEFIBER IN YOUR COFFEE OR CHEW METAMUCIL GUMMIES. USE ANUSOL TWICE DAILY FOR 14 DAYS. REPEAT FOR ANOTHER 14 DAYS IF YOUR RECTAL BLEEDING IS NOT RESOLVED. DISCUSSED BENEFITS,  AND MANAGEMENT OF HEMORRHOIDS.   FOLLOW UP IN 4 MOS.

## 2015-01-30 NOTE — Patient Instructions (Addendum)
DRINK WATER TO KEEP YOUR URINE LIGHT YELLOW.  FOLLOW A HIGH FIBER/LOW FAT DIET. MEATS SHOULD BE BAKED, BROILED, OR BOILED. AVOID FRIED FOODS.   IF YOU DON'T EAT FIBER, USE BENEFIBER IN YOUR COFFEE OR CHEW METAMUCIL GUMMIES.  AVOID FOOD THAT TRIGGERS REFLUX. SEE INFO BELOW.   TAKE DEXILANT WITH BREAKFAST FOREVER.  USE ANUSOL TWICE DAILY FOR 14 DAYS. REPEAT FOR ANOTHER 14 DAYS IF YOUR RECTAL BLEEDING IS NOT RESOLVED.  FOLLOW UP IN 4 MOS.    Lifestyle and home remedies TO CONTROL REFLUX You may eliminate or reduce the frequency of heartburn by making the following lifestyle changes:  . Control your weight. Being overweight is a major risk factor for heartburn and GERD. Excess pounds put pressure on your abdomen, pushing up your stomach and causing acid to back up into your esophagus.   . Eat smaller meals. 4 TO 6 MEALS A DAY. This reduces pressure on the lower esophageal sphincter, helping to prevent the valve from opening and acid from washing back into your esophagus.   Dolphus Jenny your belt. Clothes that fit tightly around your waist put pressure on your abdomen and the lower esophageal sphincter.   . Eliminate heartburn triggers. Everyone has specific triggers Common triggers such as fatty or fried foods, spicy food, tomato sauce, carbonated beverages, alcohol, chocolate, mint, garlic, onion, caffeine and nicotine may make heartburn worse.   Marland Kitchen Avoid stooping or bending. Tying your shoes is OK. Bending over for longer periods to weed your garden isn't, especially soon after eating.   . Don't lie down after a meal. Wait at least three to four hours after eating before going to bed, and don't lie down right after eating.   Alternative medicine . Several home remedies exist for treating GERD, but they provide only temporary relief. They include drinking baking soda (sodium bicarbonate) added to water or drinking other fluids such as baking soda mixed with cream of tartar and  water. . Although these liquids create temporary relief by neutralizing, washing away or buffering acids, eventually they aggravate the situation by adding gas and fluid to your stomach, increasing pressure and causing more acid reflux. Further, adding more sodium to your diet may increase your blood pressure and add stress to your heart, and excessive bicarbonate ingestion can alter the acid-base balance in your body.

## 2015-01-30 NOTE — Assessment & Plan Note (Addendum)
SYMPTOMS FAIRLY WELL CONTROLLED.   AVOID FOOD THAT TRIGGERS REFLUX. SEE INFO BELOW.  TAKE DEXILANT WITH BREAKFAST FOREVER. FOLLOW UP IN 4 MOS.

## 2015-02-20 ENCOUNTER — Ambulatory Visit: Payer: PRIVATE HEALTH INSURANCE | Admitting: Gastroenterology

## 2015-03-13 ENCOUNTER — Telehealth: Payer: Self-pay | Admitting: Gastroenterology

## 2015-03-13 NOTE — Telephone Encounter (Signed)
PATIENT NOW HAS [employer] AND IT WILL NOT COVER THE [place] SHE WAS TAKING.  IS THERE A GENERIC AND OR ANOTHER MEDICATION SHE CAN TAKE  [phone]    USES [facility] AND NEW INSURANCE INFO IN COMPUTER

## 2015-03-17 ENCOUNTER — Telehealth: Payer: Self-pay | Admitting: Gastroenterology

## 2015-03-17 NOTE — Telephone Encounter (Signed)
See note of 03/13/2015.

## 2015-03-17 NOTE — Telephone Encounter (Signed)
Pt called again today to see if something else was called into [facility]. She has new insurance and [employer] will not cover Dexilant. She had called last Thursday. Please advise and call her back at [phone]

## 2015-03-17 NOTE — Telephone Encounter (Signed)
I called and asked the pt to call her pharmacy and have them fax over the info that is needed so we will know if it needs a PA or just what. She said she would.

## 2015-03-18 NOTE — Telephone Encounter (Signed)
Almyra Free is working on the [place].

## 2015-03-24 ENCOUNTER — Other Ambulatory Visit: Payer: Self-pay

## 2015-03-25 MED ORDER — DEXLANSOPRAZOLE 60 MG PO CPDR: 60.0000 mg | DELAYED_RELEASE_CAPSULE | Freq: Every day | ORAL | Status: DC

## 2015-05-09 ENCOUNTER — Encounter: Payer: Self-pay | Admitting: Gastroenterology

## 2015-10-27 ENCOUNTER — Other Ambulatory Visit: Payer: Self-pay | Admitting: Internal Medicine

## 2015-10-27 DIAGNOSIS — M25551 Pain in right hip: Secondary | ICD-10-CM

## 2015-11-11 ENCOUNTER — Other Ambulatory Visit (HOSPITAL_COMMUNITY): Payer: Self-pay | Admitting: Internal Medicine

## 2015-11-11 DIAGNOSIS — Z1231 Encounter for screening mammogram for malignant neoplasm of breast: Secondary | ICD-10-CM

## 2015-11-20 ENCOUNTER — Ambulatory Visit (HOSPITAL_COMMUNITY): Payer: BLUE CROSS/BLUE SHIELD

## 2015-11-28 ENCOUNTER — Ambulatory Visit (HOSPITAL_COMMUNITY)
Admission: RE | Admit: 2015-11-28 | Discharge: 2015-11-28 | Disposition: A | Discharge status: Home / Self Care | Payer: BLUE CROSS/BLUE SHIELD | Source: Ambulatory Visit | Attending: Internal Medicine | Admitting: Internal Medicine

## 2015-11-28 DIAGNOSIS — Z1231 Encounter for screening mammogram for malignant neoplasm of breast: Secondary | ICD-10-CM

## 2016-09-03 IMAGING — MG DIGITAL SCREENING BILATERAL MAMMOGRAM WITH CAD
6 series · 6 of 6 positions shown · non-contrast
Comparison: Previous exam(s).

CLINICAL DATA: Screening.

EXAM:
DIGITAL SCREENING BILATERAL MAMMOGRAM WITH CAD

[R MLO (1 of 2)]
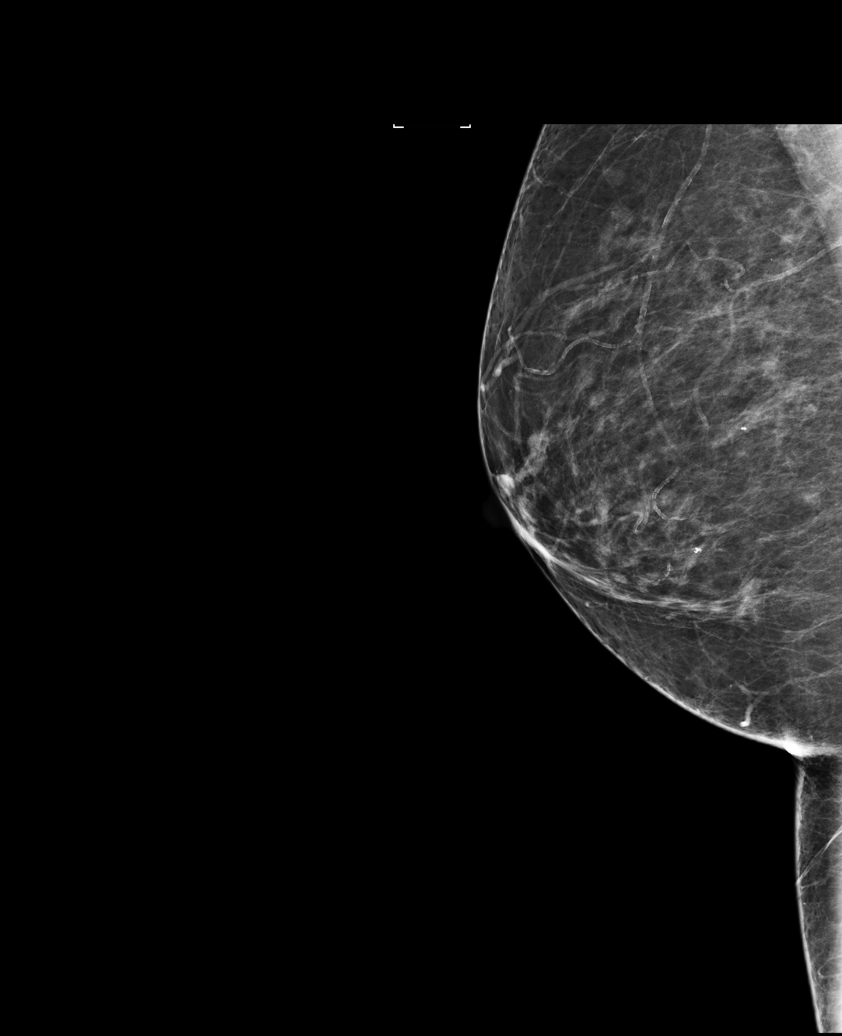

[R CC]
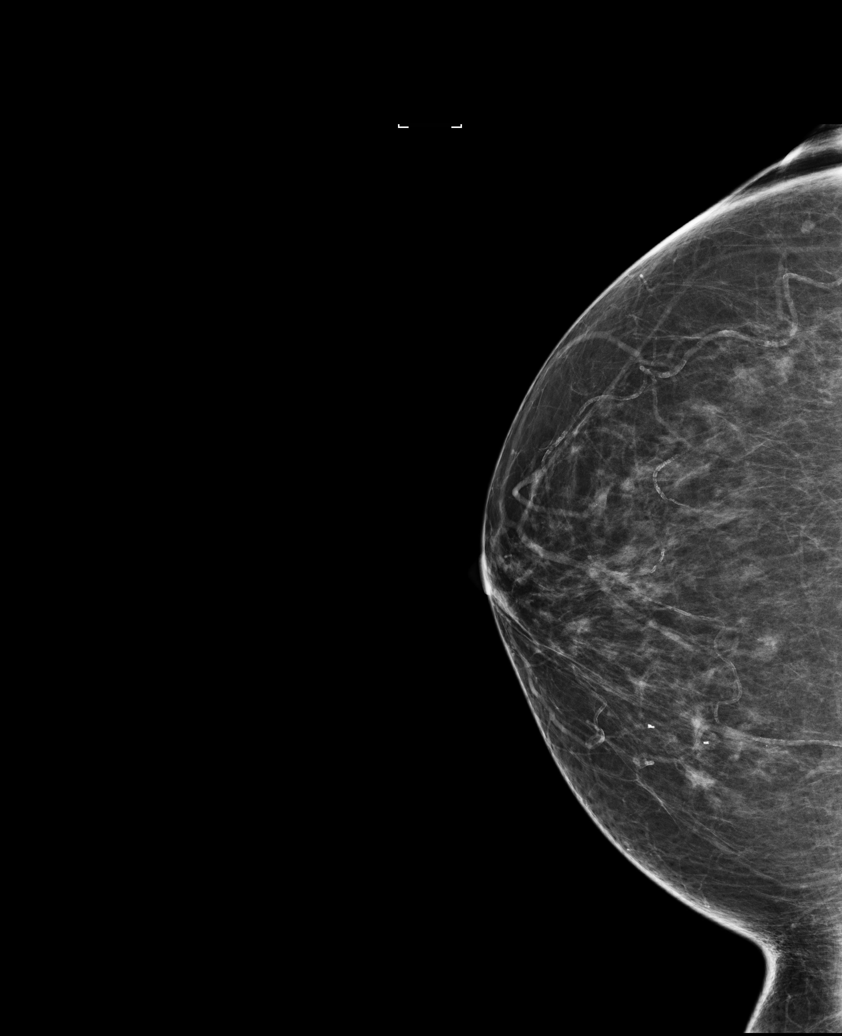

[L MLO (1 of 2)]
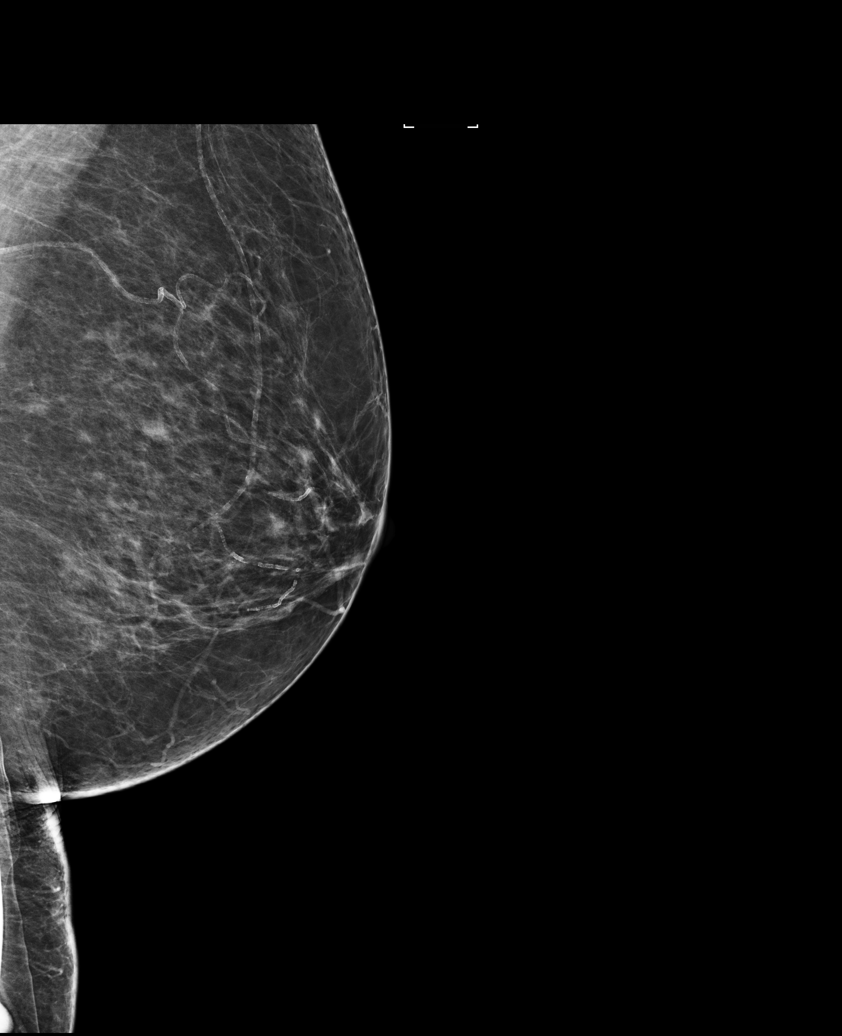

[L CC]
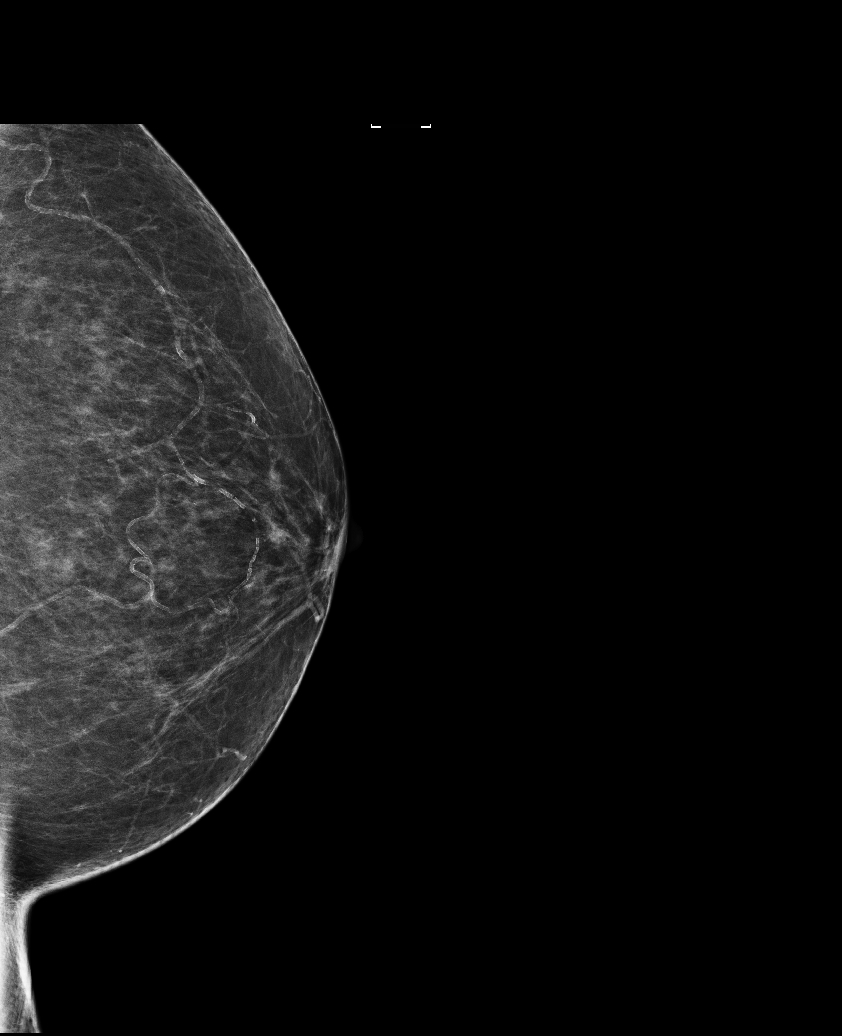

[L MLO (2 of 2)]
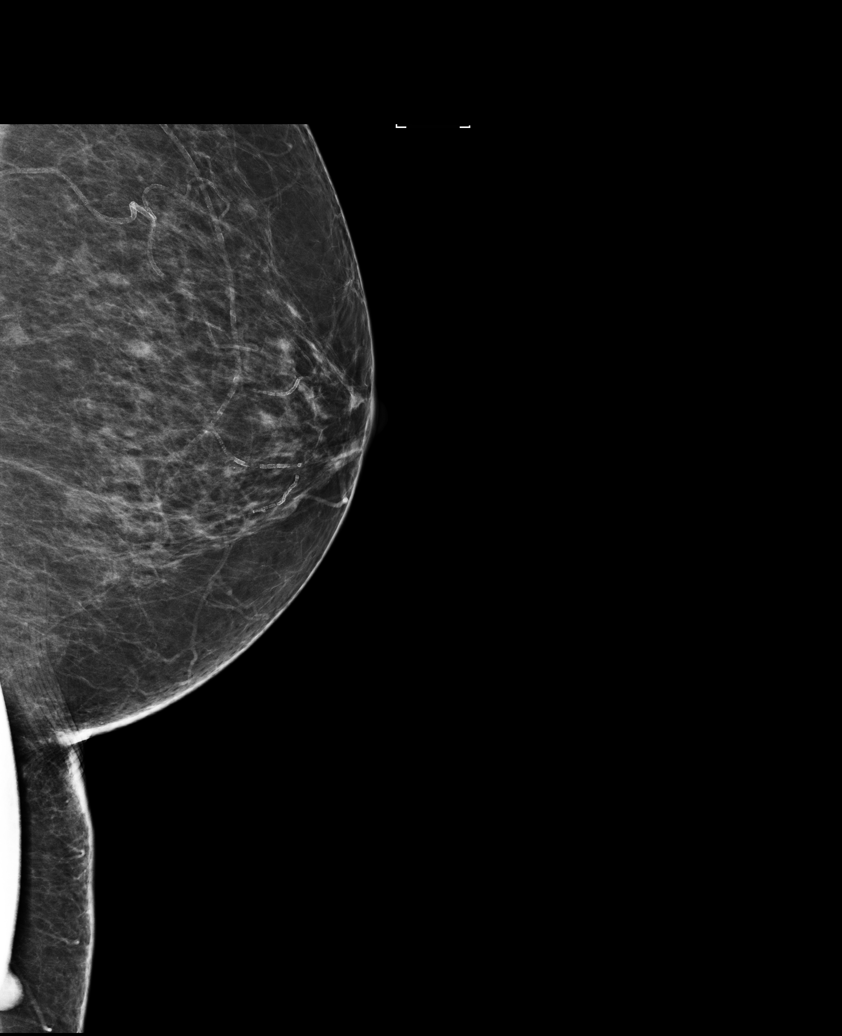

[R MLO (2 of 2)]
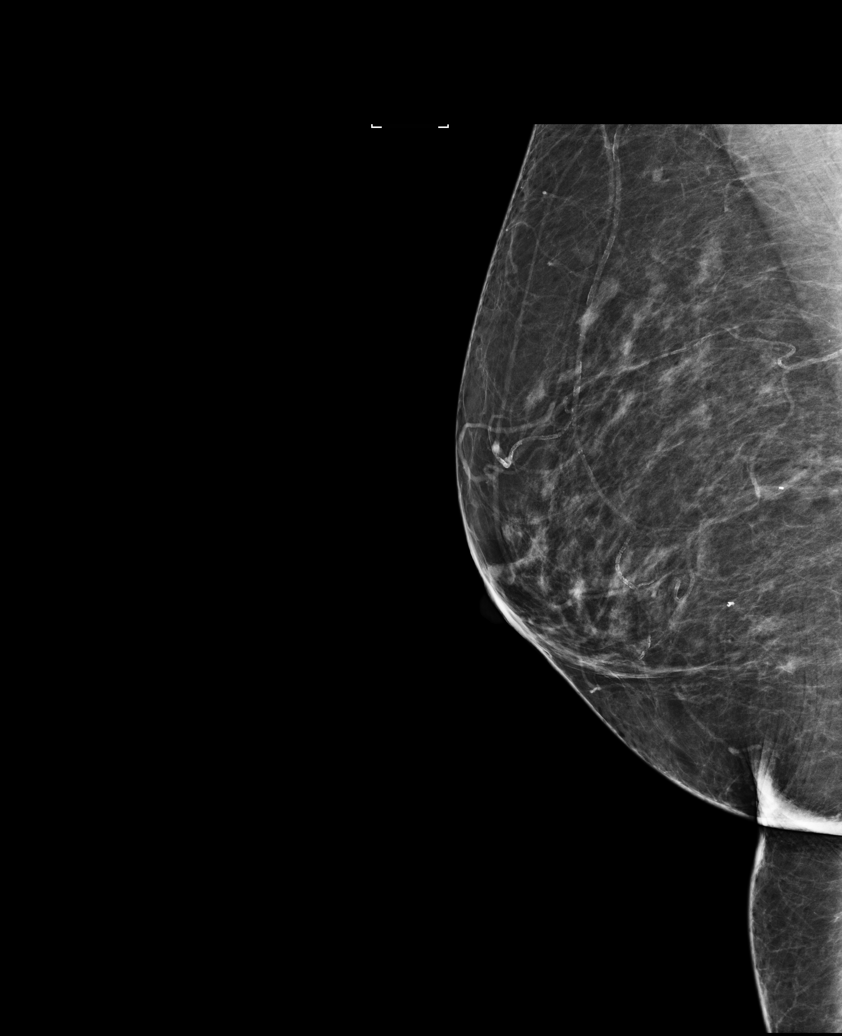

[6 of 6 positions shown; findings below may reference images not displayed]

ACR Breast Density Category b: There are scattered areas of
fibroglandular density.
FINDINGS: There are no findings suspicious for malignancy. Images were
processed with CAD.
IMPRESSION: No mammographic evidence of malignancy. A result letter of this
screening mammogram will be mailed directly to the patient.

RECOMMENDATION:
Screening mammogram in one year. (Code:AS-G-LCT)

BI-RADS CATEGORY  1: Negative.

## 2016-12-29 NOTE — Progress Notes (Signed)
REVIEWED-NO ADDITIONAL RECOMMENDATIONS. 

## 2017-02-01 ENCOUNTER — Ambulatory Visit: Payer: BLUE CROSS/BLUE SHIELD | Admitting: Family Medicine

## 2017-02-10 ENCOUNTER — Ambulatory Visit: Payer: BLUE CROSS/BLUE SHIELD | Admitting: Family Medicine

## 2017-12-29 ENCOUNTER — Encounter: Payer: Self-pay | Admitting: Gastroenterology

## 2018-01-17 ENCOUNTER — Ambulatory Visit (INDEPENDENT_AMBULATORY_CARE_PROVIDER_SITE_OTHER): Payer: BLUE CROSS/BLUE SHIELD | Admitting: Gastroenterology

## 2018-01-17 ENCOUNTER — Encounter: Payer: Self-pay | Admitting: Gastroenterology

## 2018-01-17 VITALS — BP 140/74 | HR 86 | Temp 98.4°F | Ht 67.0 in | Wt 162.4 lb

## 2018-01-17 DIAGNOSIS — M7918 Myalgia, other site: Secondary | ICD-10-CM

## 2018-01-17 DIAGNOSIS — R1030 Lower abdominal pain, unspecified: Secondary | ICD-10-CM

## 2018-01-17 DIAGNOSIS — K625 Hemorrhage of anus and rectum: Secondary | ICD-10-CM | POA: Diagnosis not present

## 2018-01-17 DIAGNOSIS — R630 Anorexia: Secondary | ICD-10-CM

## 2018-01-17 MED ORDER — HYOSCYAMINE SULFATE SL 0.125 MG SL SUBL: 1.0000 | SUBLINGUAL_TABLET | Freq: Three times a day (TID) | SUBLINGUAL | 0 refills | Status: DC | PRN

## 2018-01-17 MED ORDER — PANTOPRAZOLE SODIUM 40 MG PO TBEC: 40.0000 mg | DELAYED_RELEASE_TABLET | Freq: Every day | ORAL | 3 refills | Status: DC

## 2018-01-17 NOTE — Patient Instructions (Signed)
1. Try Levsin, dissolve one under tongue up to three times daily for lower abdominal pain/buttock pain. Hold if constipation.  2. Try pantoprazole once daily 30 minutes before breakfast.  3. Please have your labs done.  4. Return to the office in 6 weeks for follow up. If you have not noticed significant improvement, would really recommend colonoscopy and possible upper endoscopy at that time.

## 2018-01-17 NOTE — Assessment & Plan Note (Signed)
Patient with poor appetite, 5 pound weight loss in the setting of lower abdominal pain which is chronic.  Really no typical reflux although she has had a history in the past.  Complains of scratchy throat with eating certain foods.  Patient declined endoscopic evaluation at this time.  We will put her back on PPI, pantoprazole 40 mg daily.  Reevaluate in 6 weeks and decide further work-up at that time.  Update labs as well.

## 2018-01-17 NOTE — Assessment & Plan Note (Signed)
Several year history of lower abdominal pain with radiation into the thighs and left buttocks.  Improvement in pain after bowel movement.  No rectal pain with BM.  Last colonoscopy in 2013 with hemorrhoid banding, several small tubular adenomas removed and advised to come back in 10 years.  CT scan performed in 2015 previously outlined.  Offered patient colonoscopy given ongoing symptoms, blood in the stool and her last one being now 67 years old.  Not interested at this time.  We discussed trial of antispasmodic for possible pain related to IBS but will need to be careful to avoid constipation.  Will try Levsin.  Update labs.  Have her come back in 6 weeks and if no significant improvement would again recommend colonoscopy.

## 2018-01-17 NOTE — Progress Notes (Signed)
Primary Care Physician: Patient, No Pcp Per  Primary Gastroenterologist:  Barney Drain, MD   Chief Complaint  Patient presents with  . Abdominal Pain    lower abd around to buttocks  . scratchy throat    feels irritated in throat when swallows certain foods    HPI: Krystal Morris is a 67 y.o. female here further evaluation of abdominal pain.  Last seen in September 2016.  She has a history of IBS, lower abdominal pain, GERD.  Apically lower abdominal pain radiates into the left buttocks and down the thighs.  Similar to back in 2016, getting on her nerves.  Complains of pain in the left buttocks just outside of the rectum.  No pain with passing a bowel movement.  Typically has a bowel movement every day. Has BM every day. Takes Miralax when needed. Doesn't have to take every week. Some dark blood in stool at times on occasion. No blood on tissue. No pain during bowel movements.  Abdominal pain makes her have a poor appetite.  Denies nausea or vomiting.  When trying to swallow fried fish or fried chicken it scratches her throat so she tries to avoid it.  No difficulty swallowing any other foods.  No heartburn.  Notes improvement in her abdominal pain after a bowel movement.  Weight is down about 5 pounds.  Current Outpatient Medications  Medication Sig Dispense Refill  . acetaminophen (TYLENOL) 325 MG tablet Take 650 mg by mouth every 6 (six) hours as needed.     No current facility-administered medications for this visit.     Allergies as of 01/17/2018 - Review Complete 01/17/2018  Allergen Reaction Noted  . Penicillins Rash    Past Medical History:  Diagnosis Date  . Anxiety   . Diverticulosis    Mild AC simple adenoma small interal hemorrhoids moderate-sized HH mild gastritis and duodenitis  . GERD (gastroesophageal reflux disease)    Epigastric pain  . Irritable bowel syndrome   . Ulcer    Past Surgical History:  Procedure Laterality Date  . ABDOMINAL  HYSTERECTOMY    . CARDIAC SURGERY    . COLONOSCOPY  02/12/2008   Fields-Simple adenoma. Small int hemorrhoids  . COLONOSCOPY  01/28/2012   Formed stool in MID-TRANSVERSE colon Mild diverticulosis was noted in the sigmoid colon.  , and Moderate sized hemorrhoids were found  . COLONOSCOPY  02/22/2012   Three sessile polyps ranging between 3-14mm in size were found in the descending colon; polypectomy was performed/Mild diverticulosis was noted in the sigmoid colon Moderate sized internal hemorrhoids-CAUSING RECTAL BLEEDING-S/P HEMORRHOID BANDINGx2. recommended 10 year f/u tcs  . ESOPHAGOGASTRODUODENOSCOPY  02/12/2008   Fields- Nl duodenum/chronic active gastritis  . PARTIAL HYSTERECTOMY    . Thyroid Growth     Removal  . TUBAL LIGATION    . UMBILICAL HERNIA REPAIR      ROS:  General: Negative for fever, chills, fatigue, weakness.  See HPI ENT: Negative for hoarseness, difficulty swallowing , nasal congestion. CV: Negative for chest pain, angina, palpitations, dyspnea on exertion, peripheral edema.  Respiratory: Negative for dyspnea at rest, dyspnea on exertion, cough, sputum, wheezing.  GI: See history of present illness. GU:  Negative for dysuria, hematuria, urinary incontinence, urinary frequency, nocturnal urination.  Endo: Negative for unusual weight change.    Physical Examination:   BP 140/74   Pulse 86   Temp 98.4 F (36.9 C) (Oral)   Ht 5\' 7"  (1.702 m)   Wt 162 lb  6.4 oz (73.7 kg)   BMI 25.44 kg/m   General: Well-nourished, well-developed in no acute distress.  Eyes: No icterus. Mouth: Oropharyngeal mucosa moist and pink , no lesions erythema or exudate. Lungs: Clear to auscultation bilaterally.  Heart: Regular rate and rhythm, no murmurs rubs or gallops.  Abdomen: Bowel sounds are normal, nontender, nondistended, no hepatosplenomegaly or masses, no abdominal bruits or hernia , no rebound or guarding.   Rectal exam: Small skin tag 12:00.  No masses in the rectal vault.   No rectal pain.  Stool heme-negative. Extremities: No lower extremity edema. No clubbing or deformities. Neuro: Alert and oriented x 4   Skin: Warm and dry, no jaundice.   Psych: Alert and cooperative, normal mood and affect.   Imaging Studies: No results found.

## 2018-01-19 ENCOUNTER — Telehealth: Payer: Self-pay

## 2018-01-19 NOTE — Telephone Encounter (Signed)
PA started for the Hyoscyamine.

## 2018-01-24 MED ORDER — DICYCLOMINE HCL 10 MG PO CAPS: 10.0000 mg | ORAL_CAPSULE | Freq: Three times a day (TID) | ORAL | 0 refills | Status: DC | PRN

## 2018-01-24 NOTE — Addendum Note (Signed)
Addended by: Mahala Menghini on: 01/24/2018 01:27 PM   Modules accepted: Orders

## 2018-01-24 NOTE — Telephone Encounter (Signed)
LMOM for a return call.  

## 2018-01-24 NOTE — Telephone Encounter (Signed)
OK LET TRY BENTYL. rx sent to pharmacy.

## 2018-01-24 NOTE — Telephone Encounter (Signed)
Received fax from [facility] that this medication is not covered on pt's plan. Magda Paganini, please advise!

## 2018-01-25 NOTE — Telephone Encounter (Signed)
PT is aware and will call if she has questions.  

## 2018-02-01 ENCOUNTER — Encounter: Payer: Self-pay | Admitting: Gastroenterology

## 2018-03-02 ENCOUNTER — Other Ambulatory Visit (HOSPITAL_COMMUNITY): Payer: Self-pay | Admitting: *Deleted

## 2018-03-02 DIAGNOSIS — Z1231 Encounter for screening mammogram for malignant neoplasm of breast: Secondary | ICD-10-CM

## 2018-03-22 ENCOUNTER — Ambulatory Visit (HOSPITAL_COMMUNITY)
Admission: RE | Admit: 2018-03-22 | Discharge: 2018-03-22 | Disposition: A | Discharge status: Home / Self Care | Payer: BLUE CROSS/BLUE SHIELD | Source: Ambulatory Visit | Attending: Diagnostic Radiology | Admitting: Diagnostic Radiology

## 2018-03-22 ENCOUNTER — Encounter (HOSPITAL_COMMUNITY): Payer: Self-pay

## 2018-03-22 DIAGNOSIS — Z1231 Encounter for screening mammogram for malignant neoplasm of breast: Secondary | ICD-10-CM

## 2018-03-29 ENCOUNTER — Ambulatory Visit: Payer: BLUE CROSS/BLUE SHIELD | Admitting: Gastroenterology

## 2018-04-13 ENCOUNTER — Ambulatory Visit: Payer: Managed Care, Other (non HMO) | Admitting: Gastroenterology

## 2018-04-24 NOTE — Patient Instructions (Signed)
Your procedure is scheduled on: 05/01/2018  Report to [facility] at   0730 AM       Call this number if you have problems the morning of surgery: [phone]   Do not eat food or drink liquids :After Midnight.      Take these medicines the morning of surgery with A SIP OF WATER: protonix.   Do not wear jewelry, make-up or nail polish.  Do not wear lotions, powders, or perfumes. You may wear deodorant.  Do not shave 48 hours prior to surgery.  Do not bring valuables to the hospital.  Contacts, dentures or bridgework may not be worn into surgery.  Leave suitcase in the car. After surgery it may be brought to your room.  For patients admitted to the hospital, checkout time is 11:00 AM the day of discharge.   Patients discharged the day of surgery will not be allowed to drive home.  :     Please read over the following fact sheets that you were given: Coughing and Deep Breathing, Surgical Site Infection Prevention, Anesthesia Post-op Instructions and Care and Recovery After Surgery    Cataract A cataract is a clouding of the lens of the eye. When a lens becomes cloudy, vision is reduced based on the degree and nature of the clouding. Many cataracts reduce vision to some degree. Some cataracts make people more near-sighted as they develop. Other cataracts increase glare. Cataracts that are ignored and become worse can sometimes look [place]. The [place] color can be seen through the pupil. CAUSES   Aging. However, cataracts may occur at any age, even in newborns.   Certain drugs.   Trauma to the eye.   Certain diseases such as diabetes.   Specific eye diseases such as chronic inflammation inside the eye or a sudden attack of a rare form of glaucoma.   Inherited or acquired medical problems.  SYMPTOMS   Gradual, progressive drop in vision in the affected eye.   Severe, rapid visual loss. This most often happens when trauma is the cause.  DIAGNOSIS  To detect a cataract, an eye doctor  examines the lens. Cataracts are best diagnosed with an exam of the eyes with the pupils enlarged (dilated) by drops.  TREATMENT  For an early cataract, vision may improve by using different eyeglasses or stronger lighting. If that does not help your vision, surgery is the only effective treatment. A cataract needs to be surgically removed when vision loss interferes with your everyday activities, such as driving, reading, or watching TV. A cataract may also have to be removed if it prevents examination or treatment of another eye problem. Surgery removes the cloudy lens and usually replaces it with a substitute lens (intraocular lens, IOL).  At a time when both you and your doctor agree, the cataract will be surgically removed. If you have cataracts in both eyes, only one is usually removed at a time. This allows the operated eye to heal and be out of danger from any possible problems after surgery (such as infection or poor wound healing). In rare cases, a cataract may be doing damage to your eye. In these cases, your caregiver may advise surgical removal right away. The vast majority of people who have cataract surgery have better vision afterward. HOME CARE INSTRUCTIONS  If you are not planning surgery, you may be asked to do the following:  Use different eyeglasses.   Use stronger or brighter lighting.   Ask your eye doctor about  reducing your medicine dose or changing medicines if it is thought that a medicine caused your cataract. Changing medicines does not make the cataract go away on its own.   Become familiar with your surroundings. Poor vision can lead to injury. Avoid bumping into things on the affected side. You are at a higher risk for tripping or falling.   Exercise extreme care when driving or operating machinery.   Wear sunglasses if you are sensitive to bright light or experiencing problems with glare.  SEEK IMMEDIATE MEDICAL CARE IF:   You have a worsening or sudden vision  loss.   You notice redness, swelling, or increasing pain in the eye.   You have a fever.  Document Released: 05/10/2005 Document Revised: 04/29/2011 Document Reviewed: 01/01/2011 [facility] Patient Information 2012 [email or web address].PATIENT INSTRUCTIONS POST-ANESTHESIA  IMMEDIATELY FOLLOWING SURGERY:  Do not drive or operate machinery for the first twenty four hours after surgery.  Do not make any important decisions for twenty four hours after surgery or while taking narcotic pain medications or sedatives.  If you develop intractable nausea and vomiting or a severe headache please notify your doctor immediately.  FOLLOW-UP:  Please make an appointment with your surgeon as instructed. You do not need to follow up with anesthesia unless specifically instructed to do so.  WOUND CARE INSTRUCTIONS (if applicable):  Keep a dry clean dressing on the anesthesia/puncture wound site if there is drainage.  Once the wound has quit draining you may leave it open to air.  Generally you should leave the bandage intact for twenty four hours unless there is drainage.  If the epidural site drains for more than 36-48 hours please call the anesthesia department.  QUESTIONS?:  Please feel free to call your physician or the hospital operator if you have any questions, and they will be happy to assist you.

## 2018-04-28 ENCOUNTER — Other Ambulatory Visit: Payer: Self-pay

## 2018-04-28 ENCOUNTER — Encounter (HOSPITAL_COMMUNITY)
Admission: RE | Admit: 2018-04-28 | Discharge: 2018-04-28 | Disposition: A | Discharge status: Home / Self Care | Payer: BLUE CROSS/BLUE SHIELD | Source: Ambulatory Visit | Attending: Ophthalmology | Admitting: Ophthalmology

## 2018-04-28 ENCOUNTER — Encounter (HOSPITAL_COMMUNITY): Payer: Self-pay

## 2018-04-28 DIAGNOSIS — Z01812 Encounter for preprocedural laboratory examination: Secondary | ICD-10-CM | POA: Diagnosis not present

## 2018-04-28 LAB — COMPREHENSIVE METABOLIC PANEL
ALT: 16 U/L (ref 0–44)
AST: 25 U/L (ref 15–41)
Albumin: 3.9 g/dL (ref 3.5–5.0)
Alkaline Phosphatase: 45 U/L (ref 38–126)
Anion gap: 5 (ref 5–15)
BUN: 15 mg/dL (ref 8–23)
CO2: 25 mmol/L (ref 22–32)
Calcium: 9.2 mg/dL (ref 8.9–10.3)
Chloride: 111 mmol/L (ref 98–111)
Creatinine, Ser: 0.78 mg/dL (ref 0.44–1.00)
GFR calc Af Amer: >60 mL/min (ref >60)
GFR calc non Af Amer: >60 mL/min (ref >60)
Glucose, Bld: 87 mg/dL (ref 70–99)
Potassium: 3.7 mmol/L (ref 3.5–5.1)
Sodium: 141 mmol/L (ref 135–145)
Total Bilirubin: 0.5 mg/dL (ref 0.3–1.2)
Total Protein: 6.6 g/dL (ref 6.5–8.1)

## 2018-04-28 LAB — CBC WITH DIFFERENTIAL/PLATELET
Abs Immature Granulocytes: 0.01 10*3/uL (ref 0.00–0.07)
Basophils Absolute: 0 10*3/uL (ref 0.0–0.1)
Basophils Relative: 1 %
Eosinophils Absolute: 0.4 10*3/uL (ref 0.0–0.5)
Eosinophils Relative: 10 %
HCT: 43.1 % (ref 36.0–46.0)
Hemoglobin: 13.2 g/dL (ref 12.0–15.0)
Immature Granulocytes: 0 %
Lymphocytes Relative: 35 %
Lymphs Abs: 1.3 10*3/uL (ref 0.7–4.0)
MCH: 28.1 pg (ref 26.0–34.0)
MCHC: 30.6 g/dL (ref 30.0–36.0)
MCV: 91.9 fL (ref 80.0–100.0)
Monocytes Absolute: 0.3 10*3/uL (ref 0.1–1.0)
Monocytes Relative: 9 %
Neutro Abs: 1.6 10*3/uL — ABNORMAL LOW (ref 1.7–7.7)
Neutrophils Relative %: 45 %
Platelets: 199 10*3/uL (ref 150–400)
RBC: 4.69 MIL/uL (ref 3.87–5.11)
RDW: 13.6 % (ref 11.5–15.5)
WBC: 3.6 10*3/uL — ABNORMAL LOW (ref 4.0–10.5)
nRBC: 0 % (ref 0.0–0.2)

## 2018-04-28 LAB — TSH: TSH: 1.404 u[IU]/mL (ref 0.350–4.500)

## 2018-05-01 ENCOUNTER — Ambulatory Visit (HOSPITAL_COMMUNITY): Payer: BLUE CROSS/BLUE SHIELD | Admitting: Anesthesiology

## 2018-05-01 ENCOUNTER — Encounter (HOSPITAL_COMMUNITY)
Admission: RE | Disposition: A | Discharge status: Home / Self Care | Payer: Self-pay | Source: Ambulatory Visit | Attending: Ophthalmology

## 2018-05-01 ENCOUNTER — Encounter (HOSPITAL_COMMUNITY): Payer: Self-pay

## 2018-05-01 ENCOUNTER — Ambulatory Visit (HOSPITAL_COMMUNITY)
Admission: RE | Admit: 2018-05-01 | Discharge: 2018-05-01 | Disposition: A | Discharge status: Home / Self Care | Payer: BLUE CROSS/BLUE SHIELD | Source: Ambulatory Visit | Attending: Ophthalmology | Admitting: Ophthalmology

## 2018-05-01 DIAGNOSIS — Z880 Allergy status to penicillin: Secondary | ICD-10-CM | POA: Insufficient documentation

## 2018-05-01 DIAGNOSIS — K219 Gastro-esophageal reflux disease without esophagitis: Secondary | ICD-10-CM | POA: Diagnosis not present

## 2018-05-01 DIAGNOSIS — H2511 Age-related nuclear cataract, right eye: Secondary | ICD-10-CM | POA: Insufficient documentation

## 2018-05-01 HISTORY — PX: CATARACT EXTRACTION W/PHACO: SHX586

## 2018-05-01 SURGERY — PHACOEMULSIFICATION, CATARACT, WITH IOL INSERTION
Anesthesia: General | Site: Eye | Laterality: Right

## 2018-05-01 MED ORDER — TETRACAINE HCL 0.5 % OP SOLN
1.0000 [drp] | OPHTHALMIC | Status: AC
  Administered 2018-05-01 (×3): 1 [drp] via OPHTHALMIC

## 2018-05-01 MED ORDER — NEOMYCIN-POLYMYXIN-DEXAMETH 3.5-10000-0.1 OP SUSP
OPHTHALMIC | Status: DC | PRN
  Administered 2018-05-01: 2 [drp] via OPHTHALMIC

## 2018-05-01 MED ORDER — CYCLOPENTOLATE-PHENYLEPHRINE 0.2-1 % OP SOLN
1.0000 [drp] | OPHTHALMIC | Status: AC
  Administered 2018-05-01 (×3): 1 [drp] via OPHTHALMIC

## 2018-05-01 MED ORDER — LIDOCAINE HCL (PF) 1 % IJ SOLN
INTRAOCULAR | Status: DC | PRN
  Administered 2018-05-01: .7 mL via OPHTHALMIC

## 2018-05-01 MED ORDER — EPINEPHRINE PF 1 MG/ML IJ SOLN
INTRAOCULAR | Status: DC | PRN
  Administered 2018-05-01: 500 mL

## 2018-05-01 MED ORDER — BSS IO SOLN
INTRAOCULAR | Status: DC | PRN
  Administered 2018-05-01: 15 mL

## 2018-05-01 MED ORDER — PHENYLEPHRINE HCL 2.5 % OP SOLN
1.0000 [drp] | OPHTHALMIC | Status: AC
  Administered 2018-05-01 (×3): 1 [drp] via OPHTHALMIC

## 2018-05-01 MED ORDER — LACTATED RINGERS IV SOLN
INTRAVENOUS | Status: DC
  Administered 2018-05-01 (×2): via INTRAVENOUS

## 2018-05-01 MED ORDER — MIDAZOLAM HCL 2 MG/2ML IJ SOLN
INTRAMUSCULAR | Status: AC
  Filled 2018-05-01: qty 2

## 2018-05-01 MED ORDER — POVIDONE-IODINE 5 % OP SOLN
OPHTHALMIC | Status: DC | PRN
  Administered 2018-05-01: 1 via OPHTHALMIC

## 2018-05-01 MED ORDER — MIDAZOLAM HCL 5 MG/5ML IJ SOLN
INTRAMUSCULAR | Status: DC | PRN
  Administered 2018-05-01 (×2): 1 mg via INTRAVENOUS

## 2018-05-01 MED ORDER — LIDOCAINE HCL 3.5 % OP GEL
1.0000 "application " | Freq: Once | OPHTHALMIC | Status: AC
  Administered 2018-05-01: 1 via OPHTHALMIC

## 2018-05-01 MED ORDER — NA HYALUR & NA CHOND-NA HYALUR 0.55-0.5 ML IO KIT
PACK | INTRAOCULAR | Status: DC | PRN
  Administered 2018-05-01: 1 via OPHTHALMIC

## 2018-05-01 SURGICAL SUPPLY — 13 items
CLOTH BEACON ORANGE TIMEOUT ST (SAFETY) ×1 IMPLANT
EYE SHIELD UNIVERSAL CLEAR (GAUZE/BANDAGES/DRESSINGS) ×1 IMPLANT
GLOVE BIOGEL PI IND STRL 6.5 (GLOVE) IMPLANT
GLOVE BIOGEL PI IND STRL 7.0 (GLOVE) IMPLANT
GLOVE BIOGEL PI INDICATOR 6.5 (GLOVE) ×1
GLOVE BIOGEL PI INDICATOR 7.0 (GLOVE) ×1
LENS ALC ACRYL/TECN (Ophthalmic Related) ×1 IMPLANT
PAD ARMBOARD 7.5X6 YLW CONV (MISCELLANEOUS) ×1 IMPLANT
RETRACTOR IRIS SIGHTPATH (OPHTHALMIC RELATED) ×1 IMPLANT
SYRINGE LUER LOK 1CC (MISCELLANEOUS) ×1 IMPLANT
TAPE SURG TRANSPORE 1 IN (GAUZE/BANDAGES/DRESSINGS) IMPLANT
TAPE SURGICAL TRANSPORE 1 IN (GAUZE/BANDAGES/DRESSINGS) ×1
WATER STERILE IRR 250ML POUR (IV SOLUTION) ×1 IMPLANT

## 2018-05-01 NOTE — Anesthesia Preprocedure Evaluation (Signed)
Anesthesia Evaluation  Patient identified by MRN, date of birth, ID band Patient awake    Reviewed: Allergy & Precautions, NPO status , Patient's Chart, lab work & pertinent test results  Airway Mallampati: I  TM Distance: >3 FB Neck ROM: Full    Dental no notable dental hx. (+) Upper Dentures, Missing   Pulmonary neg pulmonary ROS,    Pulmonary exam normal breath sounds clear to auscultation       Cardiovascular Exercise Tolerance: Good negative cardio ROS Normal cardiovascular examI Rhythm:Regular Rate:Normal     Neuro/Psych Anxiety negative neurological ROS  negative psych ROS   GI/Hepatic Neg liver ROS, GERD  Medicated and Controlled,  Endo/Other  negative endocrine ROS  Renal/GU negative Renal ROS  negative genitourinary   Musculoskeletal negative musculoskeletal ROS (+)   Abdominal   Peds negative pediatric ROS (+)  Hematology negative hematology ROS (+)   Anesthesia Other Findings   Reproductive/Obstetrics negative OB ROS                             Anesthesia Physical Anesthesia Plan  ASA: II  Anesthesia Plan: General   Post-op Pain Management:    Induction: Intravenous  PONV Risk Score and Plan:   Airway Management Planned: Nasal Cannula  Additional Equipment:   Intra-op Plan:   Post-operative Plan:   Informed Consent: I have reviewed the patients History and Physical, chart, labs and discussed the procedure including the risks, benefits and alternatives for the proposed anesthesia with the patient or authorized representative who has indicated his/her understanding and acceptance.   Dental advisory given  Plan Discussed with: CRNA  Anesthesia Plan Comments:         Anesthesia Quick Evaluation

## 2018-05-01 NOTE — Anesthesia Postprocedure Evaluation (Signed)
Anesthesia Post Note  Patient: Krystal Morris  Procedure(s) Performed: CATARACT EXTRACTION PHACO AND INTRAOCULAR LENS PLACEMENT (IOC) (Right Eye)  Patient location during evaluation: Short Stay Anesthesia Type: General Level of consciousness: awake and patient cooperative Pain management: pain level controlled Vital Signs Assessment: post-procedure vital signs reviewed and stable Respiratory status: spontaneous breathing, nonlabored ventilation and respiratory function stable Cardiovascular status: blood pressure returned to baseline Postop Assessment: no apparent nausea or vomiting Anesthetic complications: no     Last Vitals:  Vitals:   05/01/18 0811  BP: 129/76  Pulse: 75  Resp: 18  Temp: 36.6 C  SpO2: 100%    Last Pain:  Vitals:   05/01/18 0811  TempSrc: Oral  PainSc: 0-No pain                 [name] J

## 2018-05-01 NOTE — H&P (Signed)
I have reviewed the H&P, the patient was re-examined, and I have identified no interval changes in medical condition and plan of care since the history and physical of record  

## 2018-05-01 NOTE — Op Note (Signed)
Date of Admission: 05/01/2018  Date of Surgery: 05/01/2018  Pre-Op Dx: Cataract Right  Eye  Post-Op Dx: Senile Nuclear Cataract  Right  Eye,  Dx Code H25.11  Surgeon: Tonny Branch, M.D.  Assistants: None  Anesthesia: Topical with MAC  Indications: Painless, progressive loss of vision with compromise of daily activities.  Surgery: Cataract Extraction with Intraocular lens Implant Right Eye  Discription: The patient had dilating drops and viscous lidocaine placed into the Right eye in the pre-op holding area. After transfer to the operating room, a time out was performed. The patient was then prepped and draped. Beginning with a 58m blade a paracentesis port was made at the surgeon's 2 o'clock position. The anterior chamber was then filled with 1% non-preserved lidocaine. This was followed by filling the anterior chamber with Provisc.  A 2.416mkeratome blade was used to make a clear corneal incision at the temporal limbus.  A bent cystatome needle was used to create a continuous tear capsulotomy. Hydrodissection was performed with balanced salt solution on a Fine canula. The lens nucleus was then removed using the phacoemulsification handpiece. Residual cortex was removed with the I&A handpiece. The anterior chamber and capsular bag were refilled with Provisc. A posterior chamber intraocular lens was placed into the capsular bag with it's injector. The implant was positioned with the Kuglan hook. The Provisc was then removed from the anterior chamber and capsular bag with the I&A handpiece. Stromal hydration of the main incision and paracentesis port was performed with BSS on a Fine canula. The wounds were tested for leak which was negative. The patient tolerated the procedure well. There were no operative complications. The patient was then transferred to the recovery room in stable condition.  Complications: None  Specimen: None  EBL: None  Prosthetic device: J&J Technis, PCB00, power 31.0, SN  [id number]

## 2018-05-01 NOTE — Discharge Instructions (Signed)

## 2018-05-01 NOTE — Transfer of Care (Signed)
Immediate Anesthesia Transfer of Care Note  Patient: Krystal Morris  Procedure(s) Performed: CATARACT EXTRACTION PHACO AND INTRAOCULAR LENS PLACEMENT (IOC) (Right Eye)  Patient Location: Short Stay  Anesthesia Type:MAC  Level of Consciousness: awake and patient cooperative  Airway & Oxygen Therapy: Patient Spontanous Breathing  Post-op Assessment: Report given to RN  Post vital signs: Reviewed and stable  Last Vitals:  Vitals Value Taken Time  BP    Temp    Pulse    Resp    SpO2      Last Pain:  Vitals:   05/01/18 0811  TempSrc: Oral  PainSc: 0-No pain         Complications: No apparent anesthesia complications

## 2018-05-02 ENCOUNTER — Encounter (HOSPITAL_COMMUNITY): Payer: Self-pay | Admitting: Ophthalmology

## 2018-05-10 ENCOUNTER — Encounter (HOSPITAL_COMMUNITY)
Admission: RE | Admit: 2018-05-10 | Discharge: 2018-05-10 | Disposition: A | Discharge status: Home / Self Care | Payer: BLUE CROSS/BLUE SHIELD | Source: Ambulatory Visit | Attending: Ophthalmology | Admitting: Ophthalmology

## 2018-05-10 ENCOUNTER — Encounter (HOSPITAL_COMMUNITY): Payer: Self-pay

## 2018-05-12 MED ORDER — PROMETHAZINE HCL 25 MG/ML IJ SOLN: 6.2500 mg | INTRAMUSCULAR | Status: DC | PRN

## 2018-05-12 MED ORDER — HYDROMORPHONE HCL 1 MG/ML IJ SOLN: 0.2500 mg | INTRAMUSCULAR | Status: DC | PRN

## 2018-05-12 MED ORDER — MIDAZOLAM HCL 2 MG/2ML IJ SOLN: 0.5000 mg | Freq: Once | INTRAMUSCULAR | Status: DC | PRN

## 2018-05-12 MED ORDER — HYDROCODONE-ACETAMINOPHEN 7.5-325 MG PO TABS: 1.0000 | ORAL_TABLET | Freq: Once | ORAL | Status: DC | PRN

## 2018-05-15 ENCOUNTER — Encounter (HOSPITAL_COMMUNITY)
Admission: RE | Disposition: A | Discharge status: Home / Self Care | Payer: Self-pay | Source: Home / Self Care | Attending: Ophthalmology

## 2018-05-15 ENCOUNTER — Ambulatory Visit (HOSPITAL_COMMUNITY): Payer: BLUE CROSS/BLUE SHIELD | Admitting: Anesthesiology

## 2018-05-15 ENCOUNTER — Encounter (HOSPITAL_COMMUNITY): Payer: Self-pay | Admitting: *Deleted

## 2018-05-15 ENCOUNTER — Ambulatory Visit (HOSPITAL_COMMUNITY)
Admission: RE | Admit: 2018-05-15 | Discharge: 2018-05-15 | Disposition: A | Discharge status: Home / Self Care | Payer: BLUE CROSS/BLUE SHIELD | Attending: Ophthalmology | Admitting: Ophthalmology

## 2018-05-15 DIAGNOSIS — H2512 Age-related nuclear cataract, left eye: Secondary | ICD-10-CM | POA: Diagnosis not present

## 2018-05-15 HISTORY — PX: CATARACT EXTRACTION W/PHACO: SHX586

## 2018-05-15 SURGERY — PHACOEMULSIFICATION, CATARACT, WITH IOL INSERTION
Anesthesia: Monitor Anesthesia Care | Site: Eye | Laterality: Left

## 2018-05-15 MED ORDER — BSS IO SOLN
INTRAOCULAR | Status: DC | PRN
  Administered 2018-05-15: 15 mL via INTRAOCULAR

## 2018-05-15 MED ORDER — POVIDONE-IODINE 5 % OP SOLN
OPHTHALMIC | Status: DC | PRN
  Administered 2018-05-15: 1 via OPHTHALMIC

## 2018-05-15 MED ORDER — TETRACAINE HCL 0.5 % OP SOLN
1.0000 [drp] | OPHTHALMIC | Status: AC
  Administered 2018-05-15 (×3): 1 [drp] via OPHTHALMIC

## 2018-05-15 MED ORDER — LIDOCAINE HCL 3.5 % OP GEL: 1.0000 "application " | Freq: Once | OPHTHALMIC | Status: DC

## 2018-05-15 MED ORDER — MIDAZOLAM HCL 2 MG/2ML IJ SOLN
INTRAMUSCULAR | Status: DC | PRN
  Administered 2018-05-15: 2 mg via INTRAVENOUS

## 2018-05-15 MED ORDER — LIDOCAINE HCL (PF) 1 % IJ SOLN
INTRAMUSCULAR | Status: DC | PRN
  Administered 2018-05-15: .8 mL

## 2018-05-15 MED ORDER — PHENYLEPHRINE HCL 2.5 % OP SOLN
1.0000 [drp] | OPHTHALMIC | Status: AC
  Administered 2018-05-15 (×3): 1 [drp] via OPHTHALMIC

## 2018-05-15 MED ORDER — EPINEPHRINE PF 1 MG/ML IJ SOLN
INTRAOCULAR | Status: DC | PRN
  Administered 2018-05-15: 500 mL

## 2018-05-15 MED ORDER — NEOMYCIN-POLYMYXIN-DEXAMETH 3.5-10000-0.1 OP SUSP
OPHTHALMIC | Status: DC | PRN
  Administered 2018-05-15: 2 [drp] via OPHTHALMIC

## 2018-05-15 MED ORDER — NA HYALUR & NA CHOND-NA HYALUR 0.55-0.5 ML IO KIT
PACK | INTRAOCULAR | Status: DC | PRN
  Administered 2018-05-15: 1 via OPHTHALMIC

## 2018-05-15 MED ORDER — MIDAZOLAM HCL 2 MG/2ML IJ SOLN
INTRAMUSCULAR | Status: AC
  Filled 2018-05-15: qty 2

## 2018-05-15 MED ORDER — CYCLOPENTOLATE-PHENYLEPHRINE 0.2-1 % OP SOLN
1.0000 [drp] | OPHTHALMIC | Status: AC
  Administered 2018-05-15 (×3): 1 [drp] via OPHTHALMIC

## 2018-05-15 SURGICAL SUPPLY — 12 items
CLOTH BEACON ORANGE TIMEOUT ST (SAFETY) ×1 IMPLANT
EYE SHIELD UNIVERSAL CLEAR (GAUZE/BANDAGES/DRESSINGS) ×1 IMPLANT
GLOVE BIOGEL PI IND STRL 7.0 (GLOVE) IMPLANT
GLOVE BIOGEL PI INDICATOR 7.0 (GLOVE) ×2
LENS ALC ACRYL/TECN (Ophthalmic Related) ×1 IMPLANT
NDL HYPO 18GX1.5 BLUNT FILL (NEEDLE) IMPLANT
NEEDLE HYPO 18GX1.5 BLUNT FILL (NEEDLE) ×2 IMPLANT
PAD ARMBOARD 7.5X6 YLW CONV (MISCELLANEOUS) ×1 IMPLANT
SYRINGE LUER LOK 1CC (MISCELLANEOUS) ×1 IMPLANT
TAPE SURG TRANSPORE 1 IN (GAUZE/BANDAGES/DRESSINGS) IMPLANT
TAPE SURGICAL TRANSPORE 1 IN (GAUZE/BANDAGES/DRESSINGS) ×1
WATER STERILE IRR 250ML POUR (IV SOLUTION) ×1 IMPLANT

## 2018-05-15 NOTE — Anesthesia Procedure Notes (Signed)
Procedure Name: MAC Date/Time: 05/15/2018 9:07 AM Performed by: Vista Deck, CRNA Pre-anesthesia Checklist: Patient identified, Emergency Drugs available, Suction available, Timeout performed and Patient being monitored Patient Re-evaluated:Patient Re-evaluated prior to induction Oxygen Delivery Method: Nasal Cannula

## 2018-05-15 NOTE — Anesthesia Postprocedure Evaluation (Signed)
Anesthesia Post Note  Patient: Krystal Morris  Procedure(s) Performed: CATARACT EXTRACTION PHACO AND INTRAOCULAR LENS PLACEMENT (IOC) (Left Eye)  Patient location during evaluation: Short Stay Anesthesia Type: MAC Level of consciousness: awake and alert and patient cooperative Pain management: pain level controlled Vital Signs Assessment: post-procedure vital signs reviewed and stable Respiratory status: spontaneous breathing Cardiovascular status: stable Postop Assessment: no apparent nausea or vomiting Anesthetic complications: no     Last Vitals: There were no vitals filed for this visit.  Last Pain: There were no vitals filed for this visit.               Drucie Opitz

## 2018-05-15 NOTE — Transfer of Care (Signed)
Immediate Anesthesia Transfer of Care Note  Patient: Krystal Morris  Procedure(s) Performed: CATARACT EXTRACTION PHACO AND INTRAOCULAR LENS PLACEMENT (IOC) (Left Eye)  Patient Location: Short Stay  Anesthesia Type:MAC  Level of Consciousness: awake, alert  and patient cooperative  Airway & Oxygen Therapy: Patient Spontanous Breathing  Post-op Assessment: Report given to RN and Post -op Vital signs reviewed and stable  Post vital signs: Reviewed and stable  Last Vitals:  Vitals Value Taken Time  BP    Temp    Pulse    Resp    SpO2      Last Pain: There were no vitals filed for this visit.       Complications: No apparent anesthesia complications

## 2018-05-15 NOTE — H&P (Signed)
I have reviewed the H&P, the patient was re-examined, and I have identified no interval changes in medical condition and plan of care since the history and physical of record  

## 2018-05-15 NOTE — Op Note (Signed)
Date of Admission: 05/15/2018  Date of Surgery: 05/15/2018  Pre-Op Dx: Cataract Left  Eye  Post-Op Dx: Senile Nuclear Cataract  Left  Eye,  Dx Code H25.12  Surgeon: Tonny Branch, M.D.  Assistants: None  Anesthesia: Topical with MAC  Indications: Painless, progressive loss of vision with compromise of daily activities.  Surgery: Cataract Extraction with Intraocular lens Implant Left Eye  Discription: The patient had dilating drops and viscous lidocaine placed into the Left eye in the pre-op holding area. After transfer to the operating room, a time out was performed. The patient was then prepped and draped. Beginning with a 34m blade a paracentesis port was made at the surgeon's 2 o'clock position. The anterior chamber was then filled with 1% non-preserved lidocaine. This was followed by filling the anterior chamber with Viscoat.  A 2.443mkeratome blade was used to make a clear corneal incision at the temporal limbus.  A bent cystatome needle was used to create a continuous tear capsulotomy. Hydrodissection was performed with balanced salt solution on a Fine canula. The lens nucleus was then removed using the phacoemulsification handpiece. Residual cortex was removed with the I&A handpiece. During this step zonular weakness was noted especially inferotemporal. The anterior chamber and capsular bag were refilled with Provisc. A posterior chamber intraocular lens was placed into the capsular bag with it's injector. The implant was positioned with the Kuglan hook. The Provisc was then removed from the anterior chamber and capsular bag with the I&A handpiece. Stromal hydration of the main incision and paracentesis port was performed with BSS on a Fine canula. The wounds were tested for leak which was negative. The patient tolerated the procedure well. There were no operative complications. The patient was then transferred to the recovery room in stable condition.  Complications: None  Specimen:  None  EBL: None  Prosthetic device: J&J Technis, PCB00, power 25.0 , SN [id number]

## 2018-05-15 NOTE — Discharge Instructions (Signed)
General Anesthesia, Adult, Care After  This sheet gives you information about how to care for yourself after your procedure. Your health care provider may also give you more specific instructions. If you have problems or questions, contact your health care provider.  What can I expect after the procedure?  After the procedure, the following side effects are common:  Pain or discomfort at the IV site.  Nausea.  Vomiting.  Sore throat.  Trouble concentrating.  Feeling cold or chills.  Weak or tired.  Sleepiness and fatigue.  Soreness and body aches. These side effects can affect parts of the body that were not involved in surgery.  Follow these instructions at home:    For at least 24 hours after the procedure:  Have a responsible adult stay with you. It is important to have someone help care for you until you are awake and alert.  Rest as needed.  Do not:  Participate in activities in which you could fall or become injured.  Drive.  Use heavy machinery.  Drink alcohol.  Take sleeping pills or medicines that cause drowsiness.  Make important decisions or sign legal documents.  Take care of children on your own.  Eating and drinking  Follow any instructions from your health care provider about eating or drinking restrictions.  When you feel hungry, start by eating small amounts of foods that are soft and easy to digest (bland), such as toast. Gradually return to your regular diet.  Drink enough fluid to keep your urine pale yellow.  If you vomit, rehydrate by drinking water, juice, or clear broth.  General instructions  If you have sleep apnea, surgery and certain medicines can increase your risk for breathing problems. Follow instructions from your health care provider about wearing your sleep device:  Anytime you are sleeping, including during daytime naps.  While taking prescription pain medicines, sleeping medicines, or medicines that make you drowsy.  Return to your normal activities as told by your health care  provider. Ask your health care provider what activities are safe for you.  Take over-the-counter and prescription medicines only as told by your health care provider.  If you smoke, do not smoke without supervision.  Keep all follow-up visits as told by your health care provider. This is important.  Contact a health care provider if:  You have nausea or vomiting that does not get better with medicine.  You cannot eat or drink without vomiting.  You have pain that does not get better with medicine.  You are unable to pass urine.  You develop a skin rash.  You have a fever.  You have redness around your IV site that gets worse.  Get help right away if:  You have difficulty breathing.  You have chest pain.  You have blood in your urine or stool, or you vomit blood.  Summary  After the procedure, it is common to have a sore throat or nausea. It is also common to feel tired.  Have a responsible adult stay with you for the first 24 hours after general anesthesia. It is important to have someone help care for you until you are awake and alert.  When you feel hungry, start by eating small amounts of foods that are soft and easy to digest (bland), such as toast. Gradually return to your regular diet.  Drink enough fluid to keep your urine pale yellow.  Return to your normal activities as told by your health care provider. Ask your health care   provider what activities are safe for you.  This information is not intended to replace advice given to you by your health care provider. Make sure you discuss any questions you have with your health care provider.  Document Released: 08/16/2000 Document Revised: 12/24/2016 Document Reviewed: 12/24/2016  [employer]vier Interactive Patient Education  2019 [employer]

## 2018-05-15 NOTE — Anesthesia Preprocedure Evaluation (Signed)
Anesthesia Evaluation  Patient identified by MRN, date of birth, ID band Patient awake    Reviewed: Allergy & Precautions, NPO status , Patient's Chart, lab work & pertinent test results  Airway Mallampati: II  TM Distance: >3 FB Neck ROM: Full    Dental no notable dental hx. (+) Teeth Intact   Pulmonary neg pulmonary ROS,    Pulmonary exam normal breath sounds clear to auscultation       Cardiovascular Exercise Tolerance: Good negative cardio ROS Normal cardiovascular examI Rhythm:Regular Rate:Normal     Neuro/Psych Anxiety negative neurological ROS  negative psych ROS   GI/Hepatic Neg liver ROS, GERD  ,Denies Sx today  Only uses meds prn   Endo/Other  negative endocrine ROS  Renal/GU negative Renal ROS  negative genitourinary   Musculoskeletal negative musculoskeletal ROS (+)   Abdominal   Peds negative pediatric ROS (+)  Hematology negative hematology ROS (+)   Anesthesia Other Findings   Reproductive/Obstetrics negative OB ROS                             Anesthesia Physical Anesthesia Plan  ASA: II  Anesthesia Plan: MAC   Post-op Pain Management:    Induction: Intravenous  PONV Risk Score and Plan:   Airway Management Planned: Nasal Cannula and Simple Face Mask  Additional Equipment:   Intra-op Plan:   Post-operative Plan:   Informed Consent: I have reviewed the patients History and Physical, chart, labs and discussed the procedure including the risks, benefits and alternatives for the proposed anesthesia with the patient or authorized representative who has indicated his/her understanding and acceptance.   Dental advisory given  Plan Discussed with: CRNA  Anesthesia Plan Comments:         Anesthesia Quick Evaluation

## 2018-05-16 ENCOUNTER — Encounter (HOSPITAL_COMMUNITY): Payer: Self-pay | Admitting: Ophthalmology

## 2018-09-13 ENCOUNTER — Other Ambulatory Visit: Payer: Self-pay

## 2018-09-13 ENCOUNTER — Encounter: Payer: Self-pay | Admitting: Gastroenterology

## 2018-09-13 ENCOUNTER — Ambulatory Visit (INDEPENDENT_AMBULATORY_CARE_PROVIDER_SITE_OTHER): Payer: BLUE CROSS/BLUE SHIELD | Admitting: Gastroenterology

## 2018-09-13 DIAGNOSIS — K589 Irritable bowel syndrome without diarrhea: Secondary | ICD-10-CM | POA: Diagnosis not present

## 2018-09-13 DIAGNOSIS — R1310 Dysphagia, unspecified: Secondary | ICD-10-CM

## 2018-09-13 DIAGNOSIS — M25559 Pain in unspecified hip: Secondary | ICD-10-CM

## 2018-09-13 DIAGNOSIS — K219 Gastro-esophageal reflux disease without esophagitis: Secondary | ICD-10-CM

## 2018-09-13 NOTE — Assessment & Plan Note (Signed)
LIKELY DUE TO UNCONTROLLED GERD WHILE EATING FRIED FOODS.  DISCUSSED AVOIDING FRIED FOODS. PT VOICED HER UNDERSTANDING.

## 2018-09-13 NOTE — Assessment & Plan Note (Signed)
SYMPTOMS CONTROLLED/RESOLVED WITH DIET.  AVOID REFLUX TRIGGERS.  HANDOUT GIVEN. TO PREVENT GERD, TAKE PROTONIX 30 MINUTES PRIOR TO BREAKFAST. FOLLOW UP IN 6 MOS.

## 2018-09-13 NOTE — Assessment & Plan Note (Signed)
POSTERIOR LEFT BUTTOCK PAIN PAIN MOST LIKELY DUE TO L SI [place].  DISCUSSED WITH PT. USE ALEVE PRN WITH FOOD OR MILK TO PREVENT PUD, GI BLEED, HOSPITAL STAY, AND DEATH.PT VOICED HER UNDERSTANDING. CALL WITH QUESTIONS OR CONCERNS.

## 2018-09-13 NOTE — Assessment & Plan Note (Addendum)
SYMPTOMS FAIRLY WELL CONTROLLED.  IF YOU CONSUME DAIRY, ADD LACTASE 3 PILLS WITH MEALS UP TO THREE TIMES A DAY. FOLLOW A LOW FODMAP DIET TO REDUCE ABDOMINAL PAIN.  HANDOUT GIVEN. FOLLOW UP IN 6 MOS.

## 2018-09-13 NOTE — Progress Notes (Signed)
Subjective:     Primary Care Physician:  Patient, No Pcp Per  Primary GI:  Barney Drain, MD   Patient Location: home   Provider Location: [place] office   Reason for Visit:    Persons present on the virtual encounter, with roles: patient, myself (provider), MARTINA BOOTH CMA (update meds/allergies)   Total time (minutes) spent on medical discussion:  15 MINUTES   Due to COVID-19, visit was VIA TELEPHONE VISIT DUE TO COVID 19. VISIT IS CONDUCTED VIRTUALLY AND WAS REQUESTED BY PATIENT.   Virtual Visit via TELEPHONE   I connected with  Krystal Morris  and verified that I am speaking with the correct person using two identifiers.   I discussed the limitations, risks, security and privacy concerns of performing an evaluation and management service by telephone/video and the availability of in person appointments. I also discussed with the patient that there may be a patient responsible charge related to this service. The patient expressed understanding and agreed to proceed.   Patient ID: Krystal Morris, female    DOB: 08/19/1950, 68 y.o.   MRN: [id number]  HPI PT IN HER USUAL STATE OF HEALTH NOW BUT MADE APPT FOR LOWER ABDOMINAL PAIN, L BUTTOCK PAIN AND PAIN WITH SWALLOWING. PAIN WITH SWALLOWING OCCURS ONLY WHEN SHE SWALLOWS SOMETHING FRIED: FISH OR CHICKEN, < 1-2X/MO. 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 SHE GOES UP AND DOWN THE STEPS MULTIPLE TIMES A DAY. SHE ALSO HAS LOWER ABDOMINAL PAIN: ACHY. IT CAN FEEL LIKE HER IBS PAIN BUT SOMETIMES NOT. IT CAN GET BETTER AFTER BMs. SHE DOES NOT DRINK MILK BUT EATS ICE CREAM, 1-2X/WEEK. SHE MAY EAT CHEESE AS WELL. SHE HAS 2-3 BMs A DAY, NO DIARRHEA.   PT DENIES FEVER, CHILLS, HEMATOCHEZIA, HEMATEMESIS, nausea, vomiting, melena, diarrhea, CHEST PAIN, SHORTNESS OF BREATH,  CHANGE IN BOWEL IN HABITS, constipation,  OR heartburn or indigestion.  Past Medical History:  Diagnosis Date  . Anxiety   . Diverticulosis    Mild AC simple adenoma small interal hemorrhoids moderate-sized HH mild gastritis and duodenitis  . GERD (gastroesophageal reflux disease)    Epigastric pain  . Irritable bowel syndrome   . Ulcer    Past Surgical History:  Procedure Laterality Date  . ABDOMINAL HYSTERECTOMY    . CARDIAC SURGERY    . CATARACT EXTRACTION W/PHACO Right 05/01/2018   Procedure: CATARACT EXTRACTION PHACO AND INTRAOCULAR LENS PLACEMENT (IOC);  Surgeon: Tonny Branch, MD;  Location: [facility];  Service: Ophthalmology;  Laterality: Right;  CDE: 11.14  . CATARACT EXTRACTION W/PHACO Left 05/15/2018   Procedure: CATARACT EXTRACTION PHACO AND INTRAOCULAR LENS PLACEMENT (IOC);  Surgeon: Tonny Branch, MD;  Location: [facility];  Service: Ophthalmology;  Laterality: Left;  CDE: 12.79  . COLONOSCOPY  02/12/2008   [place] Dresser-Simple adenoma. Small int hemorrhoids  . COLONOSCOPY  01/28/2012   Formed stool in MID-TRANSVERSE colon Mild diverticulosis was noted in the sigmoid colon.  , and Moderate sized hemorrhoids were found  . COLONOSCOPY  02/22/2012   Three sessile polyps ranging between 3-8mm in size were found in the descending colon; polypectomy was performed/Mild diverticulosis was noted in the sigmoid colon Moderate sized internal hemorrhoids-CAUSING RECTAL BLEEDING-S/P HEMORRHOID BANDINGx2. recommended 10 year f/u tcs  . ESOPHAGOGASTRODUODENOSCOPY  02/12/2008   Tor[place] Ey- Nl duodenum/chronic active gastritis  . PARTIAL HYSTERECTOMY    . Thyroid  Growth     Removal  . TUBAL LIGATION    . UMBILICAL HERNIA REPAIR     Allergies  Allergen Reactions  . Penicillins Rash    Has patient had a PCN reaction causing immediate rash, facial/tongue/throat swelling, SOB or lightheadedness with hypotension: Yes Has patient had a PCN reaction causing severe rash involving mucus membranes or skin necrosis: No Has patient had a PCN reaction that  required hospitalization: No Has patient had a PCN reaction occurring within the last 10 years: No If all of the above answers are "NO", then may proceed with Cephalosporin use.    Current Outpatient Medications  Medication Sig    . Multiple Vitamins-Minerals (ALIVE WOMENS ENERGY) TABS Take 1 tablet by mouth daily.    . naproxen sodium (ALEVE) 220 MG tablet Take 440 mg by mouth at bedtime as needed (back pain).    Marland Kitchen acetaminophen (TYLENOL) 325 MG tablet Take 325 mg by mouth at bedtime as needed (back pain).     .      .      . prednisoLONE-Bromfenac 1-0.075 % SOLN Place 1 drop into the right eye 3 (three) times daily. Start 3 days before surgery     Review of Systems PER HPI OTHERWISE ALL SYSTEMS ARE NEGATIVE.    Objective:   Physical Exam  TELEPHONE VISIT DUE TO COVID 19, VISIT IS CONDUCTED VIRTUALLY AND WAS REQUESTED BY PATIENT.      Assessment & Plan:

## 2018-09-13 NOTE — Progress Notes (Signed)
ON RECALL  °

## 2018-10-09 ENCOUNTER — Telehealth: Payer: Self-pay | Admitting: Gastroenterology

## 2018-10-09 NOTE — Telephone Encounter (Signed)
Lmom, waiting on a return call.  

## 2018-10-09 NOTE — Telephone Encounter (Signed)
PLEASE CALL PATIENT, SHE IS HAVING PAIN IN HER SIDE [phone]

## 2018-10-12 NOTE — Telephone Encounter (Signed)
Tried calling pt back. The number has been disconnected.

## 2019-02-20 ENCOUNTER — Encounter: Payer: Self-pay | Admitting: Gastroenterology

## 2019-04-04 ENCOUNTER — Ambulatory Visit: Payer: BLUE CROSS/BLUE SHIELD | Admitting: Gastroenterology

## 2019-05-02 ENCOUNTER — Other Ambulatory Visit: Payer: Self-pay

## 2019-05-02 ENCOUNTER — Ambulatory Visit: Payer: Medicare HMO | Admitting: Gastroenterology

## 2019-05-02 ENCOUNTER — Encounter: Payer: Self-pay | Admitting: Gastroenterology

## 2019-05-02 VITALS — BP 154/81 | HR 82 | Temp 96.9°F | Ht 67.0 in | Wt 161.0 lb

## 2019-05-02 DIAGNOSIS — M7918 Myalgia, other site: Secondary | ICD-10-CM | POA: Insufficient documentation

## 2019-05-02 DIAGNOSIS — R1031 Right lower quadrant pain: Secondary | ICD-10-CM | POA: Diagnosis not present

## 2019-05-02 DIAGNOSIS — R1310 Dysphagia, unspecified: Secondary | ICD-10-CM | POA: Diagnosis not present

## 2019-05-02 MED ORDER — PANTOPRAZOLE SODIUM 40 MG PO TBEC: 40.0000 mg | DELAYED_RELEASE_TABLET | Freq: Every day | ORAL | 3 refills | Status: DC

## 2019-05-02 NOTE — Assessment & Plan Note (Signed)
Chronic but progressive.  She has complained about this before although she reports today that the symptoms currently are a little bit different than in the past.  Can become severe enough to make her feel like she is going to pass out.  Unrelated to meals.  No findings on digital rectal exam to explain.  I suspect musculoskeletal etiology.  Obtain recent MRI and x-rays performed by orthopedist.  Use Aleve for pain relief, continue pantoprazole for GI protection.  Further recommendations to follow.

## 2019-05-02 NOTE — Progress Notes (Signed)
Primary Care Physician: Patient, No Pcp Per  Primary Gastroenterologist:  Barney Drain, MD   Chief Complaint  Patient presents with  . Rectal Pain    x few months    HPI: Krystal Morris is a 68 y.o. female here for follow-up with history of IBS, lower abdominal pain, GERD.  Last seen at time of telephone visit back in April.  Chronically she has had issues with sensation of discomfort when swallowing certain foods that are fried such as fish, chicken, cube steak.  When it happens she just stopped eating.  Essentially avoiding these foods.  Other foods go down just fine.  No problems swallowing pills.  No longer on PPI.  Denies any heartburn.  She also has chronic discomfort in the left buttock area which she is mention on multiple visits the last couple of years.  She states her symptoms are getting worse and that time her pain is so severe that she nearly passes out.  She recently was evaluated by an orthopedic in [place], she cannot recall his name.  States she had x-rays, MRI of her back and was given no explanation as the source of her pain.  Was given a shot in the left buttock area which helped for a day but then her symptoms returned.  She also has low back pain, pain in the left buttock area that radiates below the left buttocks and radiates across the midline into the right buttocks.  Happens when she is off work.  Happens while she is at work.  She ambulates quite a bit for her job.  No postprandial component.  Even saw her gynecologist wondering if it was urinary symptoms or GYN issues.  "Everything checked out fine".  She is here to see if hemorrhoids are causing her problems.  She has tried Aleve with some improvement in her symptoms but pain returns when medication wears off.  She also has right lower quadrant pain chronically.  Not necessarily related to her back pain or left buttock pain.  For the most part her stools are soft.  Rarely strains.  No melena or rectal  bleeding.   Current Outpatient Medications  Medication Sig Dispense Refill  . Multiple Vitamins-Minerals (ALIVE WOMENS ENERGY) TABS Take 1 tablet by mouth daily.    . naproxen sodium (ALEVE) 220 MG tablet Take 440 mg by mouth at bedtime as needed (back pain).     No current facility-administered medications for this visit.     Allergies as of 05/02/2019 - Review Complete 05/02/2019  Allergen Reaction Noted  . Penicillins Rash     ROS:  General: Negative for anorexia, weight loss, fever, chills, fatigue, weakness. ENT: Negative for hoarseness, difficulty swallowing , nasal congestion. CV: Negative for chest pain, angina, palpitations, dyspnea on exertion, peripheral edema.  Respiratory: Negative for dyspnea at rest, dyspnea on exertion, cough, sputum, wheezing.  GI: See history of present illness. GU:  Negative for dysuria, hematuria, urinary incontinence, urinary frequency, nocturnal urination.  Endo: Negative for unusual weight change.    Physical Examination:   BP (!) 154/81   Pulse 82   Temp (!) 96.9 F (36.1 C) (Oral)   Ht 5\' 7"  (1.702 m)   Wt 161 lb (73 kg)   BMI 25.22 kg/m   General: Well-nourished, well-developed in no acute distress.  Eyes: No icterus. Mouth: Oropharyngeal mucosa moist and pink , no lesions erythema or exudate. Lungs: Clear to auscultation bilaterally.  Heart: Regular rate and rhythm,  no murmurs rubs or gallops.  Abdomen: Bowel sounds are normal, mild RLQ tenderness, nondistended, no hepatosplenomegaly or masses, no abdominal bruits or hernia , no rebound or guarding. No tenderness with palpation over hips. RECTAL: small skin tag at [employer]. No masses externally. No evidence of perirectal abscess, fistulas, boils.  Digital rectal exam benign, nontender, no stool in the rectal vault. Extremities: No lower extremity edema. No clubbing or deformities. Neuro: Alert and oriented x 4   Skin: Warm and dry, no jaundice.   Psych: Alert and cooperative,  normal mood and affect.  Labs:  Lab Results  Component Value Date   TSH 1.404 04/28/2018   Lab Results  Component Value Date   CREATININE 0.78 04/28/2018   BUN 15 04/28/2018   NA 141 04/28/2018   K 3.7 04/28/2018   CL 111 04/28/2018   CO2 25 04/28/2018   Lab Results  Component Value Date   ALT 16 04/28/2018   AST 25 04/28/2018   ALKPHOS 45 04/28/2018   BILITOT 0.5 04/28/2018   Lab Results  Component Value Date   WBC 3.6 (L) 04/28/2018   HGB 13.2 04/28/2018   HCT 43.1 04/28/2018   MCV 91.9 04/28/2018   PLT 199 04/28/2018    Imaging Studies: No results found.

## 2019-05-02 NOTE — Assessment & Plan Note (Addendum)
Progressive right lower quadrant abdominal pain of the past several months.  Unlikely related to her left buttock pain/low back pain.  No prior imaging.  Recommend CT abdomen pelvis with contrast to rule out any issues with her appendix, malignancy.

## 2019-05-02 NOTE — Patient Instructions (Signed)
1. Start pantoprazole 40 mg daily before breakfast. 2. CT scan of abdomen to further evaluate abdominal pain. 3. I will review your records from previous MRI of the back, x-rays etc.

## 2019-05-02 NOTE — Assessment & Plan Note (Signed)
Vague symptoms only occurring when eating fried foods or cube steak.  No symptoms with eating other foods.  Trial of pantoprazole 40 mg daily before breakfast to see if this helps.  Would recommend avoiding fried foods is much as possible given her symptoms.

## 2019-05-07 ENCOUNTER — Telehealth: Payer: Self-pay | Admitting: Gastroenterology

## 2019-05-07 ENCOUNTER — Telehealth: Payer: Self-pay

## 2019-05-07 NOTE — Telephone Encounter (Signed)
Pt returned call. Pantoprazole 40 mg daily isn't covered under pts insurance. Reviewed chart. Dexilant wasn't previously covered under pts insurance. Nexium was previously tired.   Routing message to DS.

## 2019-05-07 NOTE — Telephone Encounter (Signed)
Pt called office, ok to schedule CT abd/pelvis w/contrast anytime.  CT scheduled for 05/24/19 at 4:00pm, arrive at 3:45pm. NPO 4 hours prior to test. Pick up contrast prior to appt and have creatinine drawn.  Called and informed pt of appt. Letter mailed. She already has lab order

## 2019-05-07 NOTE — Telephone Encounter (Signed)
PATIENT CALLED AND STATED HER PHARMACY IS SENDING A PRIOR AUTH REQUEST

## 2019-05-07 NOTE — Telephone Encounter (Signed)
PA for CT abd/pelvis approved via [facility]. Auth# [id number], valid 05/07/19-11/03/19.

## 2019-05-07 NOTE — Telephone Encounter (Signed)
Lmom, our office will wait on a PA form. The name of the medication needing a PA wasn't mentioned in the message sent.

## 2019-05-11 NOTE — Telephone Encounter (Signed)
No PA is needed for Pantoprapzole. Spoke to [employer] and notified the pt and the pharmacy.

## 2019-05-16 ENCOUNTER — Other Ambulatory Visit: Payer: Self-pay

## 2019-05-16 ENCOUNTER — Other Ambulatory Visit (HOSPITAL_COMMUNITY)
Admission: RE | Admit: 2019-05-16 | Discharge: 2019-05-16 | Disposition: A | Discharge status: Home / Self Care | Payer: Medicare HMO | Source: Ambulatory Visit | Attending: Gastroenterology | Admitting: Gastroenterology

## 2019-05-16 DIAGNOSIS — R1031 Right lower quadrant pain: Secondary | ICD-10-CM | POA: Insufficient documentation

## 2019-05-16 DIAGNOSIS — M7918 Myalgia, other site: Secondary | ICD-10-CM | POA: Diagnosis present

## 2019-05-16 DIAGNOSIS — R1310 Dysphagia, unspecified: Secondary | ICD-10-CM | POA: Diagnosis present

## 2019-05-16 LAB — CREATININE, SERUM
Creatinine, Ser: 0.82 mg/dL (ref 0.44–1.00)
GFR calc Af Amer: >60 mL/min (ref >60)
GFR calc non Af Amer: >60 mL/min (ref >60)

## 2019-05-24 ENCOUNTER — Ambulatory Visit (HOSPITAL_COMMUNITY)
Admission: RE | Admit: 2019-05-24 | Discharge: 2019-05-24 | Disposition: A | Discharge status: Home / Self Care | Payer: Medicare HMO | Source: Ambulatory Visit | Attending: Gastroenterology | Admitting: Gastroenterology

## 2019-05-24 ENCOUNTER — Other Ambulatory Visit: Payer: Self-pay

## 2019-05-24 DIAGNOSIS — R1031 Right lower quadrant pain: Secondary | ICD-10-CM | POA: Insufficient documentation

## 2019-05-24 MED ORDER — IOHEXOL 300 MG/ML  SOLN
100.0000 mL | Freq: Once | INTRAMUSCULAR | Status: AC | PRN
  Administered 2019-05-24: 100 mL via INTRAVENOUS

## 2019-06-28 ENCOUNTER — Telehealth: Payer: Self-pay | Admitting: Gastroenterology

## 2019-06-28 NOTE — Telephone Encounter (Signed)
Please let pt know that I finally received the MRI pelvis report from [place] (Dr. Nanine Means - orthopedic).   Patient reported pain in the left buttock that radiate to below the left buttock and then across midline to right buttock. She did have some mild right hamstring tendinopathy with "partial tear of the insertion of the left hamstrings tendon". Not sure if this explains her symptoms but if she continues to have symptoms, she should go back to orthopedic.

## 2019-07-02 NOTE — Telephone Encounter (Signed)
LMOM to call.

## 2019-07-03 NOTE — Telephone Encounter (Signed)
Pt is aware to see orthopedic for continuing problems.

## 2019-07-26 ENCOUNTER — Other Ambulatory Visit: Payer: Self-pay

## 2019-07-26 ENCOUNTER — Telehealth: Payer: Self-pay

## 2019-07-26 ENCOUNTER — Ambulatory Visit: Payer: Medicare HMO | Admitting: Gastroenterology

## 2019-07-26 ENCOUNTER — Encounter: Payer: Self-pay | Admitting: Gastroenterology

## 2019-07-26 DIAGNOSIS — K589 Irritable bowel syndrome without diarrhea: Secondary | ICD-10-CM | POA: Diagnosis not present

## 2019-07-26 DIAGNOSIS — K594 Anal spasm: Secondary | ICD-10-CM

## 2019-07-26 DIAGNOSIS — R109 Unspecified abdominal pain: Secondary | ICD-10-CM | POA: Insufficient documentation

## 2019-07-26 MED ORDER — NITROGLYCERIN 0.4 % RE OINT: 1.0000 [drp] | TOPICAL_OINTMENT | Freq: Three times a day (TID) | RECTAL | 1 refills | Status: DC

## 2019-07-26 NOTE — Progress Notes (Signed)
Subjective:    Patient ID: Krystal Morris, female    DOB: 09-21-1950, 69 y.o.   MRN: [id number]  Patient, No Pcp Per   HPI HAVING TROUBLE WITH BACK/BUTT/ LEFT LEGS. SYMPTOMS 3-4 MOS. LAST CT DEC 2020: TICS, LARGE HH. BMs: OCCASIONAL CONSTIPATION, REALLY NO APPETITE. ONLY HAVING BUTT PAIN: EVERY DAY. ORTHO DOCTOR GAVE HER A SHOT. NO TRAUMA OR INJURY.  STAYS SORE IN LOWER ABDOMEN ALL THE TIME. PAIN IN BUTT:ACHY/THROBBING. SOME DAYS FEEL WORSE. NO RECTAL BLEEDING, PRESSURE, ITCHING, BURNING, OR SOILING. NO PAIN WHEN PASSING STOOLS. BETTER: HEMORRHOIDS FOR A FEW WEEKS(NO DIFFERENCE). HARD TO GO TO SLEEPS BUT ONCE SHE GETS ASLEEP SHE STAYS ASLEEP. SITTING DOESN'T SEEM TO BOTHER IT. PAIN PRESENT MOST OF THE TIME. LAST TCS OCT 2013. PAIN SCALE: 8/10 AND FEELS LIKE LABOR PAINS.  PT DENIES FEVER, CHILLS, HEMATOCHEZIA, HEMATEMESIS, DYSURIA OR HEMATURIA, nausea, vomiting, melena, diarrhea, CHEST PAIN, SHORTNESS OF BREATH,  CHANGE IN BOWEL IN HABITS, problems swallowing, OR heartburn or indigestion.  Past Medical History:  Diagnosis Date  . Anxiety   . Diverticulosis    Mild AC simple adenoma small interal hemorrhoids moderate-sized HH mild gastritis and duodenitis  . GERD (gastroesophageal reflux disease)    Epigastric pain  . Irritable bowel syndrome   . Ulcer    Past Surgical History:  Procedure Laterality Date  . ABDOMINAL HYSTERECTOMY    . CARDIAC SURGERY    . CATARACT EXTRACTION W/PHACO Right 05/01/2018   Procedure: CATARACT EXTRACTION PHACO AND INTRAOCULAR LENS PLACEMENT (IOC);  Surgeon: Tonny Branch, MD;  Location: [facility];  Service: Ophthalmology;  Laterality: Right;  CDE: 11.14  . CATARACT EXTRACTION W/PHACO Left 05/15/2018   Procedure: CATARACT EXTRACTION PHACO AND INTRAOCULAR LENS PLACEMENT (IOC);  Surgeon: Tonny Branch, MD;  Location: [facility];  Service: Ophthalmology;  Laterality: Left;  CDE: 12.79  . COLONOSCOPY  02/12/2008   Debra Calabretta-Simple adenoma. Small int hemorrhoids  . COLONOSCOPY   01/28/2012   Formed stool in MID-TRANSVERSE colon Mild diverticulosis was noted in the sigmoid colon.  , and Moderate sized hemorrhoids were found  . COLONOSCOPY  02/22/2012   Three sessile polyps ranging between 3-64mm in size were found in the descending colon; polypectomy was performed/Mild diverticulosis was noted in the sigmoid colon Moderate sized internal hemorrhoids-CAUSING RECTAL BLEEDING-S/P HEMORRHOID BANDINGx2. recommended 10 year f/u tcs  . ESOPHAGOGASTRODUODENOSCOPY  02/12/2008   Shenique Childers- Nl duodenum/chronic active gastritis  . PARTIAL HYSTERECTOMY    . Thyroid Growth     Removal  . TUBAL LIGATION    . UMBILICAL HERNIA REPAIR     Allergies  Allergen Reactions  . Penicillins Rash       Current Outpatient Medications  Medication Sig    . acetaminophen (TYLENOL) 500 MG tablet Take 500 mg by mouth every 6 (six) hours as needed.    . Multiple Vitamins-Minerals (ALIVE WOMENS ENERGY) TABS Take 1 tablet by mouth daily.    . naproxen sodium (ALEVE) 220 MG tablet Take 440 mg by mouth at bedtime as needed (back pain).    .       Review of Systems PER HPI OTHERWISE ALL SYSTEMS ARE NEGATIVE.    Objective:   Physical Exam Constitutional:      General: She is not in acute distress.    Appearance: Normal appearance.  HENT:     Mouth/Throat:     Comments: MASK IN PLACE Eyes:     General: No scleral icterus.    Pupils: Pupils are equal, round, and  reactive to light.  Cardiovascular:     Rate and Rhythm: Normal rate and regular rhythm.     Pulses: Normal pulses.     Heart sounds: Normal heart sounds.  Pulmonary:     Effort: Pulmonary effort is normal.     Breath sounds: Normal breath sounds.  Abdominal:     General: Bowel sounds are normal.     Palpations: Abdomen is soft.     Tenderness: There is no abdominal tenderness.  Genitourinary:    Rectum: Tenderness and external hemorrhoid present. No mass or anal fissure. Normal anal tone.     Comments: MILD TENDERNESS IN THE  ANTERIOR AND POSTERIOR MIDLINE. UNABLE TO APPRECIATE ANAL FISSURE. Musculoskeletal:     Cervical back: Normal range of motion.     Right lower leg: No edema.     Left lower leg: No edema.  Lymphadenopathy:     Cervical: No cervical adenopathy.  Skin:    General: Skin is warm and dry.  Neurological:     Mental Status: She is alert and oriented to person, place, and time.     Comments: NO  NEW FOCAL DEFICITS  Psychiatric:        Mood and Affect: Mood normal.     Comments: NORMAL AFFECT        Assessment & Plan:

## 2019-07-26 NOTE — H&P (View-Only) (Signed)
Subjective:    Patient ID: Krystal Morris, female    DOB: 08/24/1950, 69 y.o.   MRN: [id number]  Patient, No Pcp Per   HPI HAVING TROUBLE WITH BACK/BUTT/ LEFT LEGS. SYMPTOMS 3-4 MOS. LAST CT DEC 2020: TICS, LARGE HH. BMs: OCCASIONAL CONSTIPATION, REALLY NO APPETITE. ONLY HAVING BUTT PAIN: EVERY DAY. ORTHO DOCTOR GAVE HER A SHOT. NO TRAUMA OR INJURY.  STAYS SORE IN LOWER ABDOMEN ALL THE TIME. PAIN IN BUTT:ACHY/THROBBING. SOME DAYS FEEL WORSE. NO RECTAL BLEEDING, PRESSURE, ITCHING, BURNING, OR SOILING. NO PAIN WHEN PASSING STOOLS. BETTER: HEMORRHOIDS FOR A FEW WEEKS(NO DIFFERENCE). HARD TO GO TO SLEEPS BUT ONCE SHE GETS ASLEEP SHE STAYS ASLEEP. SITTING DOESN'T SEEM TO BOTHER IT. PAIN PRESENT MOST OF THE TIME. LAST TCS OCT 2013. PAIN SCALE: 8/10 AND FEELS LIKE LABOR PAINS.  PT DENIES FEVER, CHILLS, HEMATOCHEZIA, HEMATEMESIS, DYSURIA OR HEMATURIA, nausea, vomiting, melena, diarrhea, CHEST PAIN, SHORTNESS OF BREATH,  CHANGE IN BOWEL IN HABITS, problems swallowing, OR heartburn or indigestion.  Past Medical History:  Diagnosis Date  . Anxiety   . Diverticulosis    Mild AC simple adenoma small interal hemorrhoids moderate-sized HH mild gastritis and duodenitis  . GERD (gastroesophageal reflux disease)    Epigastric pain  . Irritable bowel syndrome   . Ulcer    Past Surgical History:  Procedure Laterality Date  . ABDOMINAL HYSTERECTOMY    . CARDIAC SURGERY    . CATARACT EXTRACTION W/PHACO Right 05/01/2018   Procedure: CATARACT EXTRACTION PHACO AND INTRAOCULAR LENS PLACEMENT (IOC);  Surgeon: Krystal Branch, MD;  Location: [facility];  Service: Ophthalmology;  Laterality: Right;  CDE: 11.14  . CATARACT EXTRACTION W/PHACO Left 05/15/2018   Procedure: CATARACT EXTRACTION PHACO AND INTRAOCULAR LENS PLACEMENT (IOC);  Surgeon: Krystal Branch, MD;  Location: [facility];  Service: Ophthalmology;  Laterality: Left;  CDE: 12.79  . COLONOSCOPY  02/12/2008   Krystal Morris-Simple adenoma. Small int hemorrhoids  . COLONOSCOPY   01/28/2012   Formed stool in MID-TRANSVERSE colon Mild diverticulosis was noted in the sigmoid colon.  , and Moderate sized hemorrhoids were found  . COLONOSCOPY  02/22/2012   Three sessile polyps ranging between 3-7mm in size were found in the descending colon; polypectomy was performed/Mild diverticulosis was noted in the sigmoid colon Moderate sized internal hemorrhoids-CAUSING RECTAL BLEEDING-S/P HEMORRHOID BANDINGx2. recommended 10 year f/u tcs  . ESOPHAGOGASTRODUODENOSCOPY  02/12/2008   Krystal Morris- Nl duodenum/chronic active gastritis  . PARTIAL HYSTERECTOMY    . Thyroid Growth     Removal  . TUBAL LIGATION    . UMBILICAL HERNIA REPAIR     Allergies  Allergen Reactions  . Penicillins Rash       Current Outpatient Medications  Medication Sig    . acetaminophen (TYLENOL) 500 MG tablet Take 500 mg by mouth every 6 (six) hours as needed.    . Multiple Vitamins-Minerals (ALIVE WOMENS ENERGY) TABS Take 1 tablet by mouth daily.    . naproxen sodium (ALEVE) 220 MG tablet Take 440 mg by mouth at bedtime as needed (back pain).    .       Review of Systems PER HPI OTHERWISE ALL SYSTEMS ARE NEGATIVE.    Objective:   Physical Exam Constitutional:      General: She is not in acute distress.    Appearance: Normal appearance.  HENT:     Mouth/Throat:     Comments: MASK IN PLACE Eyes:     General: No scleral icterus.    Pupils: Pupils are equal, round, and  reactive to light.  Cardiovascular:     Rate and Rhythm: Normal rate and regular rhythm.     Pulses: Normal pulses.     Heart sounds: Normal heart sounds.  Pulmonary:     Effort: Pulmonary effort is normal.     Breath sounds: Normal breath sounds.  Abdominal:     General: Bowel sounds are normal.     Palpations: Abdomen is soft.     Tenderness: There is no abdominal tenderness.  Genitourinary:    Rectum: Tenderness and external hemorrhoid present. No mass or anal fissure. Normal anal tone.     Comments: MILD TENDERNESS IN THE  ANTERIOR AND POSTERIOR MIDLINE. UNABLE TO APPRECIATE ANAL FISSURE. Musculoskeletal:     Cervical back: Normal range of motion.     Right lower leg: No edema.     Left lower leg: No edema.  Lymphadenopathy:     Cervical: No cervical adenopathy.  Skin:    General: Skin is warm and dry.  Neurological:     Mental Status: She is alert and oriented to person, place, and time.     Comments: NO  NEW FOCAL DEFICITS  Psychiatric:        Mood and Affect: Mood normal.     Comments: NORMAL AFFECT        Assessment & Plan:

## 2019-07-26 NOTE — Assessment & Plan Note (Signed)
SYMPTOMS FAIRLY WELL CONTROLLED.  CONTINUE TO MONITOR SYMPTOMS. FOLLOW UP IN 4 MOS.

## 2019-07-26 NOTE — Assessment & Plan Note (Signed)
SYMPTOMS NOT IDEALLY CONTROLLED. ETIOLOGY UNCLEAR.  DRINK WATER TO KEEP YOUR URINE LIGHT YELLOW. FOLLOW A HIGH FIBER DIET. AVOID ITEMS THAT CAUSE BLOATING & GAS. Nitroglycerine ointment 0.4 % Apply a pea sized amount internally THREE times daily FOR 3 MOS. IT CAN DROP YOUR BLOOD PRESSURE OR CAUSE HEADACHES. COMPLETE SIGMOIDOSCOPY TO ASSESS RECTAL PAIN. FOLLOW UP IN 4 MOS.

## 2019-07-26 NOTE — Patient Instructions (Signed)
DRINK WATER TO KEEP YOUR URINE LIGHT YELLOW.  FOLLOW A HIGH FIBER DIET. AVOID ITEMS THAT CAUSE BLOATING & GAS.   Nitroglycerine ointment 0.4 % Apply a pea sized amount internally THREE times daily FOR 3 MOS. IT CAN DROP YOUR BLOOD PRESSURE OR CAUSE HEADACHES.  COMPLETE SIGMOIDOSCOPY TO ASSESS RECTAL PAIN.  FOLLOW UP IN 4 MOS.

## 2019-07-26 NOTE — Telephone Encounter (Signed)
No PA needed for Flex Sig per [facility] website. Decision ID# [id number].

## 2019-08-10 ENCOUNTER — Other Ambulatory Visit (HOSPITAL_COMMUNITY)
Admission: RE | Admit: 2019-08-10 | Discharge: 2019-08-10 | Disposition: A | Discharge status: Home / Self Care | Payer: Medicare Other | Source: Ambulatory Visit | Attending: Gastroenterology | Admitting: Gastroenterology

## 2019-08-10 ENCOUNTER — Other Ambulatory Visit: Payer: Self-pay

## 2019-08-10 DIAGNOSIS — Z01812 Encounter for preprocedural laboratory examination: Secondary | ICD-10-CM | POA: Insufficient documentation

## 2019-08-10 DIAGNOSIS — Z20822 Contact with and (suspected) exposure to covid-19: Secondary | ICD-10-CM | POA: Diagnosis not present

## 2019-08-11 LAB — SARS CORONAVIRUS 2 (TAT 6-24 HRS): SARS Coronavirus 2: NEGATIVE

## 2019-08-13 ENCOUNTER — Ambulatory Visit (HOSPITAL_COMMUNITY)
Admission: RE | Admit: 2019-08-13 | Discharge: 2019-08-13 | Disposition: A | Discharge status: Home / Self Care | Payer: Medicare Other | Attending: Gastroenterology | Admitting: Gastroenterology

## 2019-08-13 ENCOUNTER — Encounter (HOSPITAL_COMMUNITY): Payer: Self-pay | Admitting: Gastroenterology

## 2019-08-13 ENCOUNTER — Encounter (HOSPITAL_COMMUNITY)
Admission: RE | Disposition: A | Discharge status: Home / Self Care | Payer: Self-pay | Source: Home / Self Care | Attending: Gastroenterology

## 2019-08-13 ENCOUNTER — Other Ambulatory Visit: Payer: Self-pay

## 2019-08-13 DIAGNOSIS — K648 Other hemorrhoids: Secondary | ICD-10-CM | POA: Insufficient documentation

## 2019-08-13 DIAGNOSIS — K589 Irritable bowel syndrome without diarrhea: Secondary | ICD-10-CM | POA: Diagnosis not present

## 2019-08-13 DIAGNOSIS — Z8719 Personal history of other diseases of the digestive system: Secondary | ICD-10-CM | POA: Insufficient documentation

## 2019-08-13 DIAGNOSIS — K5790 Diverticulosis of intestine, part unspecified, without perforation or abscess without bleeding: Secondary | ICD-10-CM

## 2019-08-13 DIAGNOSIS — K449 Diaphragmatic hernia without obstruction or gangrene: Secondary | ICD-10-CM | POA: Diagnosis not present

## 2019-08-13 DIAGNOSIS — K649 Unspecified hemorrhoids: Secondary | ICD-10-CM | POA: Diagnosis not present

## 2019-08-13 DIAGNOSIS — Z86010 Personal history of colonic polyps: Secondary | ICD-10-CM | POA: Diagnosis not present

## 2019-08-13 DIAGNOSIS — Z880 Allergy status to penicillin: Secondary | ICD-10-CM | POA: Diagnosis not present

## 2019-08-13 DIAGNOSIS — K5730 Diverticulosis of large intestine without perforation or abscess without bleeding: Secondary | ICD-10-CM | POA: Diagnosis not present

## 2019-08-13 DIAGNOSIS — K644 Residual hemorrhoidal skin tags: Secondary | ICD-10-CM | POA: Insufficient documentation

## 2019-08-13 DIAGNOSIS — K6289 Other specified diseases of anus and rectum: Secondary | ICD-10-CM | POA: Insufficient documentation

## 2019-08-13 DIAGNOSIS — Z90711 Acquired absence of uterus with remaining cervical stump: Secondary | ICD-10-CM | POA: Diagnosis not present

## 2019-08-13 HISTORY — PX: FLEXIBLE SIGMOIDOSCOPY: SHX5431

## 2019-08-13 SURGERY — SIGMOIDOSCOPY, FLEXIBLE
Anesthesia: Moderate Sedation

## 2019-08-13 MED ORDER — MEPERIDINE HCL 100 MG/ML IJ SOLN
INTRAMUSCULAR | Status: AC
  Filled 2019-08-13: qty 2

## 2019-08-13 MED ORDER — MIDAZOLAM HCL 5 MG/5ML IJ SOLN
INTRAMUSCULAR | Status: AC
  Filled 2019-08-13: qty 10

## 2019-08-13 MED ORDER — SODIUM CHLORIDE 0.9 % IV SOLN
INTRAVENOUS | Status: DC
  Administered 2019-08-13: 1000 mL via INTRAVENOUS

## 2019-08-13 MED ORDER — MEPERIDINE HCL 100 MG/ML IJ SOLN
INTRAMUSCULAR | Status: DC | PRN
  Administered 2019-08-13 (×2): 25 mg via INTRAVENOUS

## 2019-08-13 MED ORDER — STERILE WATER FOR IRRIGATION IR SOLN
Status: DC | PRN
  Administered 2019-08-13: 1.5 mL

## 2019-08-13 MED ORDER — MIDAZOLAM HCL 5 MG/5ML IJ SOLN
INTRAMUSCULAR | Status: DC | PRN
  Administered 2019-08-13 (×2): 2 mg via INTRAVENOUS

## 2019-08-13 NOTE — Op Note (Signed)
[facility] Patient Name: Krystal Morris Procedure Date: 08/13/2019 7:31 AM MRN: [id number] Date of Birth: [birth year] Attending MD: Barney Drain MD, MD CSN: [id number] Age: 69 Admit Type: Outpatient Procedure:                Flexible Sigmoidoscopy, DIAGNOSTIC Indications:              Rectal pain. PELVIC MRI AUG 2020: DEGENERATIVE                            DISEASE WITH NARROWING OF FORAMINA. CT ABD/PELVIS                            DEC 2020: NAIAP, LARGE HIATAL HERNIA/DIVERTICULOSIS Providers:                Barney Drain MD, MD, Otis Peak B. Sharon Seller, RN, Nelma Rothman, Technician Referring MD:             Marlynn Perking Medicines:                Meperidine 50 mg IV, Midazolam 4 mg IV Complications:            No immediate complications. Estimated Blood Loss:     Estimated blood loss: none. Procedure:                Pre-Anesthesia Assessment:                           - Prior to the procedure, a History and Physical                            was performed, and patient medications and                            allergies were reviewed. The patient's tolerance of                            previous anesthesia was also reviewed. The risks                            and benefits of the procedure and the sedation                            options and risks were discussed with the patient.                            All questions were answered, and informed consent                            was obtained. Prior Anticoagulants: The patient has                            taken no previous anticoagulant or antiplatelet  agents. ASA Grade Assessment: I - A normal, healthy                            patient. After reviewing the risks and benefits,                            the patient was deemed in satisfactory condition to                            undergo the procedure. After obtaining informed                            consent, the scope was  passed under direct vision.                            The PCF-[id number] scope was introduced                            through the anus and advanced to the the sigmoid                            colon. The flexible sigmoidoscopy was accomplished                            without difficulty. The patient tolerated the                            procedure well. The quality of the bowel                            preparation was excellent. Scope In: 8:34:18 AM Scope Out: 8:38:12 AM Total Procedure Duration: 0 hours 3 minutes 54 seconds  Findings:      Hemorrhoids were found on perianal exam.      Internal hemorrhoids were found. The hemorrhoids were small.      Multiple small and large-mouthed diverticula were found in the       recto-sigmoid colon and sigmoid colon. Impression:               - MODERATE EXTERNAL Hemorrhoids found on perianal                            exam.                           - SMALL Internal hemorrhoids.                           - MODERATE Diverticulosis in the recto-sigmoid                            colon and in the sigmoid colon.                           - RECTAL PAIN MAY BE DUE TO SPASM OF ANAL  SPHICNTER, LESS LIKELY HEMORRHOIDS OR REFERRED PAIN                            FROM DDD Moderate Sedation:      Moderate (conscious) sedation was administered by the endoscopy nurse       and supervised by the endoscopist. The following parameters were       monitored: oxygen saturation, heart rate, blood pressure, and response       to care. Total physician intraservice time was 11 minutes. Recommendation:           - Patient has a contact number available for                            emergencies. The signs and symptoms of potential                            delayed complications were discussed with the                            patient. Return to normal activities tomorrow.                            Written discharge  instructions were provided to the                            patient.                           - High fiber diet.                           - Continue present medications. ADD NTG OINTMENT                            TID FOR 3 MOS                           - Return to GI office in 4 months. Procedure Code(s):        --- Professional ---                           [phone], Sigmoidoscopy, flexible; diagnostic,                            including collection of specimen(s) by brushing or                            washing, when performed (separate procedure)                           G0500, Moderate sedation services provided by the                            same physician or other qualified health care  professional performing a gastrointestinal                            endoscopic service that sedation supports,                            requiring the presence of an independent trained                            observer to assist in the monitoring of the                            patient's level of consciousness and physiological                            status; initial 15 minutes of intra-service time;                            patient age 29 years or older (additional time may                            be reported with [phone], as appropriate) Diagnosis Code(s):        --- Professional ---                           K64.8, Other hemorrhoids                           K62.89, Other specified diseases of anus and rectum                           K57.30, Diverticulosis of large intestine without                            perforation or abscess without bleeding CPT copyright 2019 American Medical Association. All rights reserved. The codes documented in this report are preliminary and upon coder review may  be revised to meet current compliance requirements. Barney Drain, MD Barney Drain MD, MD 08/13/2019 9:02:20 AM This report has been signed  electronically. Number of Addenda: 0

## 2019-08-13 NOTE — Discharge Instructions (Signed)
YOU DID NOT HAVE ANY POLYPS. You have EXTERNAL and INTERNAL hemorrhoids and DIVERTICULOSIS IN YOUR LEFT COLON.    FOLLOW A HIGH FIBER DIET. AVOID ITEMS THAT CAUSE BLOATING & GAS. SEE INFO BELOW.  Nitroglycerine ointment 0.4 % Apply a pea sized amount internally THREE times daily FOR 3 MOS. IT CAN DROP YOUR BLOOD PRESSURE OR CAUSE HEADACHES.  FOLLOW UP IN 4 MOS.   NEXT COLONOSCOPY IN OCT 2023.    ENDOSCOPY Care After Read the instructions outlined below and refer to this sheet in the next week. These discharge instructions provide you with general information on caring for yourself after you leave the hospital. While your treatment has been planned according to the most current medical practices available, unavoidable complications occasionally occur. If you have any problems or questions after discharge, call DR. Man Bonneau, [phone].  ACTIVITY  You may resume your regular activity, but move at a slower pace for the next 24 hours.   Take frequent rest periods for the next 24 hours.   Walking will help get rid of the air and reduce the bloated feeling in your belly (abdomen).   No driving for 24 hours (because of the medicine (anesthesia) used during the test).   You may shower.   Do not sign any important legal documents or operate any machinery for 24 hours (because of the anesthesia used during the test).    NUTRITION  Drink plenty of fluids.   You may resume your normal diet as instructed by your doctor.   Begin with a light meal and progress to your normal diet. Heavy or fried foods are harder to digest and may make you feel sick to your stomach (nauseated).   Avoid alcoholic beverages for 24 hours or as instructed.    MEDICATIONS  You may resume your normal medications.   WHAT YOU CAN EXPECT TODAY  Some feelings of bloating in the abdomen.   Passage of more gas than usual.   Spotting of blood in your stool or on the toilet paper  .  IF YOU HADBIOPSIES  TAKEN  DURING THE SIGMOIDOSCOPY/UPPER ENDOSCOPY:  Eat a soft diet IF YOU HAVE NAUSEA, BLOATING, ABDOMINAL PAIN, OR VOMITING.    FINDING OUT THE RESULTS OF YOUR TEST Not all test results are available during your visit. DR. Oneida Alar WILL CALL YOU WITHIN 7 DAYS OF YOUR PROCEDUE WITH YOUR RESULTS. Do not assume everything is normal if you have not heard from DR. Charonda Hefter IN ONE WEEK, CALL HER OFFICE AT [phone].  SEEK IMMEDIATE MEDICAL ATTENTION AND CALL THE OFFICE: [phone] IF:  You have more than a spotting of blood in your stool.   Your belly is swollen (abdominal distention).   You are nauseated or vomiting.   You have a temperature over 101F.   You have abdominal pain or discomfort that is severe or gets worse throughout the day.    High-Fiber Diet A high-fiber diet changes your normal diet to include more whole grains, legumes, fruits, and vegetables. Changes in the diet involve replacing refined carbohydrates with unrefined foods. The calorie level of the diet is essentially unchanged. The Dietary Reference Intake (recommended amount) for adult males is 38 grams per day. For adult females, it is 25 grams per day. Pregnant and lactating women should consume 28 grams of fiber per day. Fiber is the intact part of a plant that is not broken down during digestion. Functional fiber is fiber that has been isolated from the plant to provide a beneficial  effect in the body.  PURPOSE  Increase stool bulk.   Ease and regulate bowel movements.   Lower cholesterol.   REDUCE RISK OF COLON CANCER  INDICATIONS THAT YOU NEED MORE FIBER  Constipation and hemorrhoids.   Uncomplicated diverticulosis (intestine condition) and irritable bowel syndrome.   Weight management.   As a protective measure against hardening of the arteries (atherosclerosis), diabetes, and cancer.   GUIDELINES FOR INCREASING FIBER IN THE DIET  Start adding fiber to the diet slowly. A gradual increase of about  5 more grams (2 servings of most fruits or vegetables) per day is best. Too rapid an increase in fiber may result in constipation, flatulence, and bloating.   Drink enough water and fluids to keep your urine clear or pale yellow. Water, juice, or caffeine-free drinks are recommended. Not drinking enough fluid may cause constipation.   Eat a variety of high-fiber foods rather than one type of fiber.   Try to increase your intake of fiber through using high-fiber foods rather than fiber pills or supplements that contain small amounts of fiber.   The goal is to change the types of food eaten. Do not supplement your present diet with high-fiber foods, but replace foods in your present diet.

## 2019-08-13 NOTE — Interval H&P Note (Signed)
History and Physical Interval Note:  08/13/2019 8:25 AM  Krystal Morris  has presented today for surgery, with the diagnosis of rectal pain.  The various methods of treatment have been discussed with the patient and family. After consideration of risks, benefits and other options for treatment, the patient has consented to  Procedure(s) with comments: FLEXIBLE SIGMOIDOSCOPY (N/A) - 7:30am as a surgical intervention.  The patient's history has been reviewed, patient examined, no change in status, stable for surgery.  I have reviewed the patient's chart and labs.  Questions were answered to the patient's satisfaction.     [employer]

## 2019-10-03 ENCOUNTER — Telehealth: Payer: Self-pay | Admitting: Gastroenterology

## 2019-10-03 NOTE — Telephone Encounter (Signed)
Called lmom

## 2019-10-03 NOTE — Telephone Encounter (Signed)
[phone]  PLEASE CALL PATIENT-HER SIDE IS VERY SORE AND FOR THE PAST MONTH IT IS CONSTANTLY HURTING.  PATIENT SCHEDULED FOR FOLLOW UP IN July.  STATED WHEN SHE GETS UP IN THE MORNING IT HURTS VERY BAD AND IT TAKES HER A WHILE TO GET GOING.

## 2019-10-04 NOTE — Telephone Encounter (Signed)
Called verified name and dob right side flank pain pt states this has been going on for several months. Unable to lay on her back and thinks it could be due to her Hemorids. Last bm was this morning she states it was a normal color. Pt stated she is not running a fever, denies n/v. Pt states she has been taking aleve and took it las night for a headache.

## 2019-10-05 NOTE — Telephone Encounter (Signed)
Called lmom

## 2019-10-05 NOTE — Telephone Encounter (Signed)
Patient really needs a follow up visit. I don't think her flank pain is related to her hemorrhoids. If her pain worsens in the interim, she should go to the ER. She has been extensively evaluated in the past for abdominal pain. ?radicular pain from her back. Needs evaluation.  Can we get her in next week? If need to she should be on a cancellation list. Needs face to face.

## 2019-10-08 NOTE — Telephone Encounter (Signed)
Called lmom. Krystal Morris I haven't been able to contact her. Are you able to place her on the list, just in case?

## 2019-10-09 NOTE — Telephone Encounter (Signed)
noted 

## 2019-10-09 NOTE — Telephone Encounter (Signed)
I cancelled the July OV since she was moved up to something sooner and she is also on the cancellation list.

## 2019-10-09 NOTE — Telephone Encounter (Signed)
Called verified name and dob Notified pt of providers recommendations  pt stated she would go to er if she needs to.  She stated it is right flank pain  Made appt for 6/2 @ 11:30 , its the 1st available Also, notified pt she was placed on cancellation list if we get anything sooner   Pt stated she understood and thanked me for the call

## 2019-10-09 NOTE — Telephone Encounter (Signed)
I don't have any cancellations yet, but I will call her once something opens up for next week.

## 2019-10-10 ENCOUNTER — Ambulatory Visit: Payer: Medicare Other | Admitting: Nurse Practitioner

## 2019-10-24 ENCOUNTER — Ambulatory Visit: Payer: Medicare Other | Admitting: Gastroenterology

## 2019-11-27 ENCOUNTER — Ambulatory Visit: Payer: Medicare Other | Admitting: Gastroenterology

## 2020-02-06 ENCOUNTER — Ambulatory Visit: Payer: Medicare Other | Admitting: Gastroenterology

## 2020-02-06 ENCOUNTER — Encounter: Payer: Self-pay | Admitting: Gastroenterology

## 2020-02-06 ENCOUNTER — Other Ambulatory Visit: Payer: Self-pay

## 2020-02-06 VITALS — BP 161/87 | HR 96 | Temp 97.1°F | Ht 67.0 in | Wt 165.2 lb

## 2020-02-06 DIAGNOSIS — R1031 Right lower quadrant pain: Secondary | ICD-10-CM | POA: Diagnosis not present

## 2020-02-06 DIAGNOSIS — G8929 Other chronic pain: Secondary | ICD-10-CM

## 2020-02-06 MED ORDER — DICYCLOMINE HCL 10 MG PO CAPS: 10.0000 mg | ORAL_CAPSULE | Freq: Three times a day (TID) | ORAL | 3 refills | Status: DC

## 2020-02-06 NOTE — Progress Notes (Signed)
Referring Provider: No ref. provider found Primary Care Physician:  Dorothyann Peng, FNP  Primary GI: Dr. Abbey Chatters  Chief Complaint  Patient presents with  . Abdominal Pain    RLQ- sometimes pain is worse, tenderness/soreness all the time    HPI:   Krystal Morris is a 69 y.o. 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 History of chronic RLQ pain. CT Dec 2020 without acute findings, large hiatal hernia, distal colonic diverticulosis without acute inflammation.   Chronic RLQ pain, sore all the time. Present for the past year. When it flares up, notices slimy, yellow stool. Looser stool at that time. Has BM about 2-3 times per day. Sometimes cramping prior to BM. Feels better after. Not diarrhea but more frequent, "milkshake" consistency. No further rectal pain or spasms. If eating fried foods, will feel it going down esophagus. No solid food dysphagia. No rectal bleeding.   Past Medical History:  Diagnosis Date  . Anxiety   . Diverticulosis    Mild AC simple adenoma small interal hemorrhoids moderate-sized HH mild gastritis and duodenitis  . GERD (gastroesophageal reflux disease)    Epigastric pain  . Irritable bowel syndrome   . Ulcer     Past Surgical History:  Procedure Laterality Date  . ABDOMINAL HYSTERECTOMY    . CARDIAC SURGERY    . CATARACT EXTRACTION W/PHACO Right 05/01/2018   Procedure: CATARACT EXTRACTION PHACO AND INTRAOCULAR LENS PLACEMENT (IOC);  Surgeon: Tonny Branch, MD;  Location: [facility];  Service: Ophthalmology;  Laterality: Right;  CDE: 11.14  . CATARACT EXTRACTION W/PHACO Left 05/15/2018   Procedure: CATARACT EXTRACTION PHACO AND INTRAOCULAR LENS PLACEMENT (IOC);  Surgeon: Tonny Branch, MD;  Location: [facility];  Service: Ophthalmology;  Laterality: Left;  CDE:  12.79  . COLONOSCOPY  02/12/2008   Fields-Simple adenoma. Small int hemorrhoids  . COLONOSCOPY  01/28/2012   Formed stool in MID-TRANSVERSE colon Mild diverticulosis was noted in the sigmoid colon.  , and Moderate sized hemorrhoids were found  . COLONOSCOPY  02/22/2012   Three sessile polyps ranging between 3-69mm in size were found in the descending colon (tubular adenomas); polypectomy was performed/Mild diverticulosis was noted in the sigmoid colon Moderate sized internal hemorrhoids-CAUSING RECTAL BLEEDING-S/P HEMORRHOID BANDINGx2. recommended 10 year f/u tcs  . ESOPHAGOGASTRODUODENOSCOPY  02/12/2008   Fields- Nl duodenum/chronic active gastritis  . FLEXIBLE SIGMOIDOSCOPY N/A 08/13/2019   moderate external hemorrhoids, small internal hemorrhoids, moderate diverticulosis in rectosigmoid and sigmoid colon.   Mar[place] Kitchen PARTIAL HYSTERECTOMY    . Thyroid Growth     Removal  . TUBAL LIGATION    . UMBILICAL HERNIA REPAIR      Current Outpatient Medications  Medication Sig Dispense Refill  . dicyclomine (BENTYL) 10 MG capsule Take 1 capsule (10 mg total) by mouth 3 (three) times daily before meals. As needed for abdominal cramping 120 capsule 3   No current facility-administered medications for this visit.    Allergies as of 02/06/2020 - Review Complete 02/06/2020  Allergen Reaction Noted  . Penicillins Rash     Family History  Problem Relation Age of Onset  . Cancer Son   . COPD Mother   . COPD Father   . Drug abuse Sister        medication overdose  . Coronary artery disease Son   . Colon  polyps Daughter     Social History   Socioeconomic History  . Marital status: Divorced    Spouse name: Not on file  . Number of children: 3  . Years of education: Not on file  . Highest education level: Not on file  Occupational History  . Occupation: Barrister's clerk: [employer]  Tobacco Use  . Smoking status: Never Smoker  . Smokeless tobacco: Never Used  . Tobacco comment: Never  smoked  Vaping Use  . Vaping Use: Never used  Substance and Sexual Activity  . Alcohol use: No    Alcohol/week: 0.0 standard drinks  . Drug use: No  . Sexual activity: Not Currently    Birth control/protection: Surgical  Other Topics Concern  . Not on file  Social History Narrative   Lost 1 son, 3 living   Social Determinants of Health   Financial Resource Strain:   . Difficulty of Paying Living Expenses: Not on file  Food Insecurity:   . Worried About Charity fundraiser in the Last Year: Not on file  . Ran Out of Food in the Last Year: Not on file  Transportation Morris:   . Lack of Transportation (Medical): Not on file  . Lack of Transportation (Non-Medical): Not on file  Physical Activity:   . Days of Exercise per Week: Not on file  . Minutes of Exercise per Session: Not on file  Stress:   . Feeling of Stress : Not on file  Social Connections:   . Frequency of Communication with Friends and Family: Not on file  . Frequency of Social Gatherings with Friends and Family: Not on file  . Attends Religious Services: Not on file  . Active Member of Clubs or Organizations: Not on file  . Attends Archivist Meetings: Not on file  . Marital Status: Not on file    Review of Systems: Gen: Denies fever, chills, anorexia. Denies fatigue, weakness, weight loss.  CV: Denies chest pain, palpitations, syncope, peripheral edema, and claudication. Resp: Denies dyspnea at rest, cough, wheezing, coughing up blood, and pleurisy. GI: see HPI Derm: Denies rash, itching, dry skin Psych: Denies depression, anxiety, memory loss, confusion. No homicidal or suicidal ideation.  Heme: Denies bruising, bleeding, and enlarged lymph nodes.  Physical Exam: BP (!) 161/87   Pulse 96   Temp (!) 97.1 F (36.2 C) (Oral)   Ht 5\' 7"  (1.702 m)   Wt 165 lb 3.2 oz (74.9 kg)   BMI 25.87 kg/m  General:   Alert and oriented. No distress noted. Pleasant and cooperative.  Head:  Normocephalic and  atraumatic. Eyes:  Conjuctiva clear without scleral icterus. Mouth:  Mask in place Abdomen:  +BS, soft, RLQ TTP and non-distended. No rebound or guarding. No HSM or masses noted. Msk:  Symmetrical without gross deformities. Normal posture. Extremities:  Without edema. Neurologic:  Alert and  oriented x4 Psych:  Alert and cooperative. Normal mood and affect.  ASSESSMENT: LOAN OGUIN is a 69 y.o. female presenting today with a history of RLQ pain dating back one year, CT Dec 2020 without acute findings. Last complete colonoscopy in 2013 with flex sig recently on file.   History of IBS could be playing a role. Will check CBC, CMP now. Trial of Bentyl before meals with side effects discussed. Will likely be updating imaging and may ultimately need colonoscopy. If all unrevealing, will refer to surgery.    PLAN:  CBC, CMP today Likely updating CT, colonoscopy  thereafter Trial of Bentyl Further recommendations to follow   Krystal Needs, PhD, ANP-BC [facility]

## 2020-02-06 NOTE — Progress Notes (Signed)
Cc'ed to pcp °

## 2020-02-06 NOTE — Patient Instructions (Signed)
Let's try dicyclomine two to three times per day before meals. Side effects would be constipation, dry mouth, and dizziness.  Please have blood work done when you are able.  Let me know how you do with the dicyclomine! If no improvement, we will do a CT scan.  It was a pleasure to see you today. I want to create trusting relationships with patients to provide genuine, compassionate, and quality care. I value your feedback. If you receive a survey regarding your visit,  I greatly appreciate you taking time to fill this out.   Annitta Needs, PhD, ANP-BC [facility]

## 2020-02-12 ENCOUNTER — Other Ambulatory Visit (HOSPITAL_COMMUNITY): Payer: Self-pay | Admitting: Family Medicine

## 2020-02-12 DIAGNOSIS — Z1231 Encounter for screening mammogram for malignant neoplasm of breast: Secondary | ICD-10-CM

## 2020-02-12 LAB — CBC WITH DIFFERENTIAL/PLATELET
Absolute Monocytes: 349 cells/uL (ref 200–950)
Basophils Absolute: 59 cells/uL (ref 0–200)
Basophils Relative: 1.4 %
Eosinophils Absolute: 139 cells/uL (ref 15–500)
Eosinophils Relative: 3.3 %
HCT: 39.8 % (ref 35.0–45.0)
Hemoglobin: 12.7 g/dL (ref 11.7–15.5)
Lymphs Abs: 1247 cells/uL (ref 850–3900)
MCH: 27.7 pg (ref 27.0–33.0)
MCHC: 31.9 g/dL — ABNORMAL LOW (ref 32.0–36.0)
MCV: 86.9 fL (ref 80.0–100.0)
MPV: 11.8 fL (ref 7.5–12.5)
Monocytes Relative: 8.3 %
Neutro Abs: 2407 cells/uL (ref 1500–7800)
Neutrophils Relative %: 57.3 %
Platelets: 231 10*3/uL (ref 140–400)
RBC: 4.58 10*6/uL (ref 3.80–5.10)
RDW: 13.7 % (ref 11.0–15.0)
Total Lymphocyte: 29.7 %
WBC: 4.2 10*3/uL (ref 3.8–10.8)

## 2020-02-12 LAB — COMPLETE METABOLIC PANEL WITH GFR
AG Ratio: 2 (calc) (ref 1.0–2.5)
ALT: 10 U/L (ref 6–29)
AST: 15 U/L (ref 10–35)
Albumin: 4.1 g/dL (ref 3.6–5.1)
Alkaline phosphatase (APISO): 56 U/L (ref 37–153)
BUN: 10 mg/dL (ref 7–25)
CO2: 28 mmol/L (ref 20–32)
Calcium: 9.6 mg/dL (ref 8.6–10.4)
Chloride: 109 mmol/L (ref 98–110)
Creat: 0.79 mg/dL (ref 0.60–0.93)
GFR, Est African American: 87 mL/min/{1.73_m2} (ref >=60)
GFR, Est Non African American: 75 mL/min/{1.73_m2} (ref >=60)
Globulin: 2.1 g/dL (calc) (ref 1.9–3.7)
Glucose, Bld: 114 mg/dL — ABNORMAL HIGH (ref 65–99)
Potassium: 4.1 mmol/L (ref 3.5–5.3)
Sodium: 143 mmol/L (ref 135–146)
Total Bilirubin: 0.4 mg/dL (ref 0.2–1.2)
Total Protein: 6.2 g/dL (ref 6.1–8.1)

## 2020-02-13 NOTE — Progress Notes (Signed)
CBC unrevealing. CMP unrevealing. Chronic RLQ Pain. Is she better on Bentyl? If not, we need to pursue a CT abd/pelvis.

## 2020-02-14 ENCOUNTER — Other Ambulatory Visit: Payer: Self-pay

## 2020-02-14 ENCOUNTER — Ambulatory Visit (HOSPITAL_COMMUNITY)
Admission: RE | Admit: 2020-02-14 | Discharge: 2020-02-14 | Disposition: A | Discharge status: Home / Self Care | Payer: Medicare Other | Source: Ambulatory Visit | Attending: Family Medicine | Admitting: Family Medicine

## 2020-02-14 DIAGNOSIS — Z1231 Encounter for screening mammogram for malignant neoplasm of breast: Secondary | ICD-10-CM | POA: Insufficient documentation

## 2020-02-15 ENCOUNTER — Telehealth: Payer: Self-pay | Admitting: Internal Medicine

## 2020-02-15 ENCOUNTER — Other Ambulatory Visit: Payer: Self-pay | Admitting: *Deleted

## 2020-02-15 DIAGNOSIS — R1031 Right lower quadrant pain: Secondary | ICD-10-CM

## 2020-02-15 NOTE — Telephone Encounter (Signed)
PATIENT RETURNED CALL, [place]

## 2020-02-29 ENCOUNTER — Ambulatory Visit (HOSPITAL_COMMUNITY)
Admission: RE | Admit: 2020-02-29 | Discharge: 2020-02-29 | Disposition: A | Discharge status: Home / Self Care | Payer: Medicare Other | Source: Ambulatory Visit | Attending: Gastroenterology | Admitting: Gastroenterology

## 2020-02-29 ENCOUNTER — Other Ambulatory Visit: Payer: Self-pay

## 2020-02-29 DIAGNOSIS — R1031 Right lower quadrant pain: Secondary | ICD-10-CM

## 2020-02-29 MED ORDER — IOHEXOL 300 MG/ML  SOLN
100.0000 mL | Freq: Once | INTRAMUSCULAR | Status: AC | PRN
  Administered 2020-02-29: 100 mL via INTRAVENOUS

## 2020-03-04 ENCOUNTER — Telehealth: Payer: Self-pay | Admitting: Internal Medicine

## 2020-03-04 NOTE — Telephone Encounter (Signed)
Lmom, waiting on a return call.  

## 2020-03-04 NOTE — Telephone Encounter (Signed)
Spoke with pt. Pt states that she has been having abdominal pain since her CT scan on 02/29/20. Pt was seen in office on 02/06/20. Pt was asked at her apt to take Dicyclomine before meals. Pt hasn't really taken the medication because she felt that she had abdominal pain more than abdominal cramping. Pt doesn't eat up on a daily basis because pt states that she just doesn't eat much. Pt did take Dicyclomine yesterday due to the pain in her RUQ abdomen. Pt reports no n/v. Pt took an enema to help her bowels move and they did after taking an enema. When asked if her bowels move daily, pt said they move when she takes an enema.

## 2020-03-04 NOTE — Telephone Encounter (Signed)
PATIENT CALLED AND SAID THAT SINCE HER CT SCAN HER STOMACH HAS BEEN HURTING   PLEASE CALL

## 2020-03-04 NOTE — Telephone Encounter (Signed)
   Please let patient know that CT showed no concerning findings of appendicitis. She had mild diffuse wall thickening of her urinary bladder: does she have any urinary symptoms?   At her visit, she was noting 2-3 bowel movements a day, sometimes cramping. If she is missing days without a BM, recommend Miralax each evening that she does not have a BM.  We need to get a colonoscopy with Dr. Abbey Chatters arranged. ASA 2.  Diagnosis: chronic RLQ pain. She has not had a recent colonoscopy. If this is negative, recommend surgical referral.

## 2020-03-05 NOTE — Telephone Encounter (Signed)
LMOVM for pt 

## 2020-03-05 NOTE — Telephone Encounter (Signed)
Spoke with pt. Pt was notified of results. Discussed Miralax on the days pt doesn't have a BM. Please arrange TCS with Dr. Abbey Chatters, [place].

## 2020-03-05 NOTE — Telephone Encounter (Signed)
Pt returned call. She has been scheduled for 11/1 at 12:00pm. Aware will mail instructions w/ covid test appt. Confirmed mailing address.

## 2020-03-21 ENCOUNTER — Other Ambulatory Visit (HOSPITAL_COMMUNITY)
Admission: RE | Admit: 2020-03-21 | Discharge: 2020-03-21 | Disposition: A | Discharge status: Home / Self Care | Payer: Medicare Other | Source: Ambulatory Visit | Attending: Internal Medicine | Admitting: Internal Medicine

## 2020-03-21 ENCOUNTER — Other Ambulatory Visit: Payer: Self-pay

## 2020-03-21 DIAGNOSIS — Z01812 Encounter for preprocedural laboratory examination: Secondary | ICD-10-CM | POA: Diagnosis present

## 2020-03-21 DIAGNOSIS — Z20822 Contact with and (suspected) exposure to covid-19: Secondary | ICD-10-CM | POA: Diagnosis not present

## 2020-03-21 LAB — SARS CORONAVIRUS 2 (TAT 6-24 HRS): SARS Coronavirus 2: NEGATIVE

## 2020-03-24 ENCOUNTER — Ambulatory Visit (HOSPITAL_COMMUNITY): Payer: Medicare Other | Admitting: Anesthesiology

## 2020-03-24 ENCOUNTER — Encounter (HOSPITAL_COMMUNITY): Payer: Self-pay

## 2020-03-24 ENCOUNTER — Encounter (HOSPITAL_COMMUNITY)
Admission: RE | Disposition: A | Discharge status: Home / Self Care | Payer: Self-pay | Source: Home / Self Care | Attending: Internal Medicine

## 2020-03-24 ENCOUNTER — Other Ambulatory Visit: Payer: Self-pay

## 2020-03-24 ENCOUNTER — Ambulatory Visit (HOSPITAL_COMMUNITY)
Admission: RE | Admit: 2020-03-24 | Discharge: 2020-03-24 | Disposition: A | Discharge status: Home / Self Care | Payer: Medicare Other | Attending: Internal Medicine | Admitting: Internal Medicine

## 2020-03-24 DIAGNOSIS — R1031 Right lower quadrant pain: Secondary | ICD-10-CM | POA: Diagnosis not present

## 2020-03-24 DIAGNOSIS — K648 Other hemorrhoids: Secondary | ICD-10-CM | POA: Diagnosis not present

## 2020-03-24 DIAGNOSIS — K5730 Diverticulosis of large intestine without perforation or abscess without bleeding: Secondary | ICD-10-CM

## 2020-03-24 DIAGNOSIS — D122 Benign neoplasm of ascending colon: Secondary | ICD-10-CM | POA: Diagnosis not present

## 2020-03-24 DIAGNOSIS — Z86010 Personal history of colonic polyps: Secondary | ICD-10-CM | POA: Insufficient documentation

## 2020-03-24 DIAGNOSIS — K635 Polyp of colon: Secondary | ICD-10-CM | POA: Diagnosis not present

## 2020-03-24 HISTORY — PX: BIOPSY: SHX5522

## 2020-03-24 HISTORY — PX: COLONOSCOPY WITH PROPOFOL: SHX5780

## 2020-03-24 HISTORY — PX: POLYPECTOMY: SHX5525

## 2020-03-24 SURGERY — COLONOSCOPY WITH PROPOFOL
Anesthesia: General

## 2020-03-24 MED ORDER — CHLORHEXIDINE GLUCONATE CLOTH 2 % EX PADS: 6.0000 | MEDICATED_PAD | Freq: Once | CUTANEOUS | Status: DC

## 2020-03-24 MED ORDER — PROPOFOL 10 MG/ML IV BOLUS
INTRAVENOUS | Status: DC | PRN
  Administered 2020-03-24: 60 mg via INTRAVENOUS

## 2020-03-24 MED ORDER — STERILE WATER FOR IRRIGATION IR SOLN
Status: DC | PRN
  Administered 2020-03-24: 1.5 mL

## 2020-03-24 MED ORDER — PROPOFOL 500 MG/50ML IV EMUL
INTRAVENOUS | Status: DC | PRN
  Administered 2020-03-24: 150 ug/kg/min via INTRAVENOUS

## 2020-03-24 MED ORDER — LACTATED RINGERS IV SOLN: INTRAVENOUS | Status: DC

## 2020-03-24 MED ORDER — LACTATED RINGERS IV SOLN: INTRAVENOUS | Status: DC | PRN

## 2020-03-24 NOTE — Anesthesia Preprocedure Evaluation (Signed)
Anesthesia Evaluation  Patient identified by MRN, date of birth, ID band Patient awake    Reviewed: Allergy & Precautions, H&P , NPO status , Patient's Chart, lab work & pertinent test results, reviewed documented beta blocker date and time   Airway Mallampati: II  TM Distance: >3 FB Neck ROM: full    Dental no notable dental hx. (+) Teeth Intact   Pulmonary neg pulmonary ROS,    Pulmonary exam normal breath sounds clear to auscultation       Cardiovascular Exercise Tolerance: Good negative cardio ROS   Rhythm:regular Rate:Normal     Neuro/Psych PSYCHIATRIC DISORDERS Anxiety  Neuromuscular disease    GI/Hepatic Neg liver ROS, GERD  Medicated,  Endo/Other  negative endocrine ROS  Renal/GU negative Renal ROS  negative genitourinary   Musculoskeletal   Abdominal   Peds  Hematology negative hematology ROS (+)   Anesthesia Other Findings   Reproductive/Obstetrics negative OB ROS                             Anesthesia Physical Anesthesia Plan  ASA: II  Anesthesia Plan: General   Post-op Pain Management:    Induction:   PONV Risk Score and Plan: Propofol infusion  Airway Management Planned:   Additional Equipment:   Intra-op Plan:   Post-operative Plan:   Informed Consent: I have reviewed the patients History and Physical, chart, labs and discussed the procedure including the risks, benefits and alternatives for the proposed anesthesia with the patient or authorized representative who has indicated his/her understanding and acceptance.     Dental Advisory Given  Plan Discussed with: CRNA  Anesthesia Plan Comments:         Anesthesia Quick Evaluation

## 2020-03-24 NOTE — Transfer of Care (Signed)
Immediate Anesthesia Transfer of Care Note  Patient: Krystal Morris  Procedure(s) Performed: COLONOSCOPY WITH PROPOFOL (N/A ) POLYPECTOMY BIOPSY  Patient Location: PACU  Anesthesia Type:General  Level of Consciousness: awake, alert , oriented and patient cooperative  Airway & Oxygen Therapy: Patient Spontanous Breathing  Post-op Assessment: Report given to RN, Post -op Vital signs reviewed and stable and Patient moving all extremities X 4  Post vital signs: Reviewed and stable  Last Vitals:  Vitals Value Taken Time  BP    Temp    Pulse    Resp    SpO2      Last Pain:  Vitals:   03/24/20 0922  TempSrc:   PainSc: 0-No pain      Patients Stated Pain Goal: 7 (47/58/30 7460)  Complications: No complications documented.

## 2020-03-24 NOTE — Discharge Instructions (Addendum)
Colonoscopy Discharge Instructions  Read the instructions outlined below and refer to this sheet in the next few weeks. These discharge instructions provide you with general information on caring for yourself after you leave the hospital. Your doctor may also give you specific instructions. While your treatment has been planned according to the most current medical practices available, unavoidable complications occasionally occur.   ACTIVITY  You may resume your regular activity, but move at a slower pace for the next 24 hours.   Take frequent rest periods for the next 24 hours.   Walking will help get rid of the air and reduce the bloated feeling in your belly (abdomen).   No driving for 24 hours (because of the medicine (anesthesia) used during the test).    Do not sign any important legal documents or operate any machinery for 24 hours (because of the anesthesia used during the test).  NUTRITION  Drink plenty of fluids.   You may resume your normal diet as instructed by your doctor.   Begin with a light meal and progress to your normal diet. Heavy or fried foods are harder to digest and may make you feel sick to your stomach (nauseated).   Avoid alcoholic beverages for 24 hours or as instructed.  MEDICATIONS  You may resume your normal medications unless your doctor tells you otherwise.  WHAT YOU CAN EXPECT TODAY  Some feelings of bloating in the abdomen.   Passage of more gas than usual.   Spotting of blood in your stool or on the toilet paper.  IF YOU HAD POLYPS REMOVED DURING THE COLONOSCOPY:  No aspirin products for 7 days or as instructed.   No alcohol for 7 days or as instructed.   Eat a soft diet for the next 24 hours.  FINDING OUT THE RESULTS OF YOUR TEST Not all test results are available during your visit. If your test results are not back during the visit, make an appointment with your caregiver to find out the results. Do not assume everything is normal if  you have not heard from your caregiver or the medical facility. It is important for you to follow up on all of your test results.  SEEK IMMEDIATE MEDICAL ATTENTION IF:  You have more than a spotting of blood in your stool.   Your belly is swollen (abdominal distention).   You are nauseated or vomiting.   You have a temperature over 101.   You have abdominal pain or discomfort that is severe or gets worse throughout the day.   Your colonoscopy revealed 4 polyp(s) which I removed successfully. Await pathology results, my office will contact you. I recommend repeating colonoscopy in 5 years for surveillance purposes.   I took biopsies your colon to rule out a condition called microscopic colitis.  We should have these results back within a week as well.  Follow-up with [name] in 2 months.   I hope you have a great rest of your week!  Elon Alas. Abbey Chatters, D.O. Gastroenterology and Hepatology [facility]   Colon Polyps  Polyps are tissue growths inside the body. Polyps can grow in many places, including the large intestine (colon). A polyp may be a round bump or a mushroom-shaped growth. You could have one polyp or several. Most colon polyps are noncancerous (benign). However, some colon polyps can become cancerous over time. Finding and removing the polyps early can help prevent this. What are the causes? The exact cause of colon polyps is not  known. What increases the risk? You are more likely to develop this condition if you:  Have a family history of colon cancer or colon polyps.  Are older than 17 or older than 45 if you are African American.  Have inflammatory bowel disease, such as ulcerative colitis or Crohn's disease.  Have certain hereditary conditions, such as: ? Familial adenomatous polyposis. ? Lynch syndrome. ? Turcot syndrome. ? Peutz-Jeghers syndrome.  Are overweight.  Smoke cigarettes.  Do not get enough exercise.  Drink  too much alcohol.  Eat a diet that is high in fat and red meat and low in fiber.  Had childhood cancer that was treated with abdominal radiation. What are the signs or symptoms? Most polyps do not cause symptoms. If you have symptoms, they may include:  Blood coming from your rectum when having a bowel movement.  Blood in your stool. The stool may look dark red or black.  Abdominal pain.  A change in bowel habits, such as constipation or diarrhea. How is this diagnosed? This condition is diagnosed with a colonoscopy. This is a procedure in which a lighted, flexible scope is inserted into the anus and then passed into the colon to examine the area. Polyps are sometimes found when a colonoscopy is done as part of routine cancer screening tests. How is this treated? Treatment for this condition involves removing any polyps that are found. Most polyps can be removed during a colonoscopy. Those polyps will then be tested for cancer. Additional treatment may be needed depending on the results of testing. Follow these instructions at home: Lifestyle  Maintain a healthy weight, or lose weight if recommended by your health care provider.  Exercise every day or as told by your health care provider.  Do not use any products that contain nicotine or tobacco, such as cigarettes and e-cigarettes. If you need help quitting, ask your health care provider.  If you drink alcohol, limit how much you have: ? 0-1 drink a day for women. ? 0-2 drinks a day for men.  Be aware of how much alcohol is in your drink. In the [place] one drink equals one 12 oz bottle of beer (355 mL), one 5 oz glass of wine (148 mL), or one 1 oz shot of hard liquor (44 mL). Eating and drinking   Eat foods that are high in fiber, such as fruits, vegetables, and whole grains.  Eat foods that are high in calcium and vitamin D, such as milk, cheese, yogurt, eggs, liver, fish, and broccoli.  Limit foods that are high in fat,  such as fried foods and desserts.  Limit the amount of red meat and processed meat you eat, such as hot dogs, sausage, bacon, and lunch meats. General instructions  Keep all follow-up visits as told by your health care provider. This is important. ? This includes having regularly scheduled colonoscopies. ? Talk to your health care provider about when you need a colonoscopy. Contact a health care provider if:  You have new or worsening bleeding during a bowel movement.  You have new or increased blood in your stool.  You have a change in bowel habits.  You lose weight for no known reason. Summary  Polyps are tissue growths inside the body. Polyps can grow in many places, including the colon.  Most colon polyps are noncancerous (benign), but some can become cancerous over time.  This condition is diagnosed with a colonoscopy.  Treatment for this condition involves removing any polyps that are  found. Most polyps can be removed during a colonoscopy. This information is not intended to replace advice given to you by your health care provider. Make sure you discuss any questions you have with your health care provider. Document Revised: 08/25/2017 Document Reviewed: 08/25/2017 Elsevier Patient Education  [employer].

## 2020-03-24 NOTE — Anesthesia Postprocedure Evaluation (Signed)
Anesthesia Post Note  Patient: Krystal Morris  Procedure(s) Performed: COLONOSCOPY WITH PROPOFOL (N/A ) POLYPECTOMY BIOPSY  Patient location during evaluation: Phase II Anesthesia Type: General Level of consciousness: awake, oriented, awake and alert and patient cooperative Pain management: satisfactory to patient Vital Signs Assessment: post-procedure vital signs reviewed and stable Respiratory status: spontaneous breathing, respiratory function stable and nonlabored ventilation Cardiovascular status: stable Postop Assessment: no apparent nausea or vomiting Anesthetic complications: no   No complications documented.   Last Vitals:  Vitals:   03/24/20 0814  BP: 126/79  Pulse: 71  Resp: 20  Temp: 36.9 C  SpO2: 99%    Last Pain:  Vitals:   03/24/20 0922  TempSrc:   PainSc: 0-No pain                 Param Capri

## 2020-03-24 NOTE — Op Note (Signed)
[facility] Patient Name: Krystal Morris Procedure Date: 03/24/2020 9:11 AM MRN: [id number] Date of Birth: 1950/10/24 Attending MD: Elon Alas. Abbey Chatters DO CSN: [id number] Age: 69 Admit Type: Outpatient Procedure:                Colonoscopy Indications:              Abdominal pain in the right lower quadrant Providers:                Elon Alas. Abbey Chatters, DO, Tacy Learn,                            Technician Referring MD:              Medicines:                See the Anesthesia note for documentation of the                            administered medications Complications:            No immediate complications. Estimated Blood Loss:     Estimated blood loss was minimal. Procedure:                Pre-Anesthesia Assessment:                           - The anesthesia plan was to use monitored                            anesthesia care (MAC).                           After obtaining informed consent, the colonoscope                            was passed under direct vision. Throughout the                            procedure, the patient's blood pressure, pulse, and                            oxygen saturations were monitored continuously. The                            [id number]) was introduced through the anus                            and advanced to the the terminal ileum, with                            identification of the appendiceal orifice and IC                            valve. The colonoscopy was performed without                            difficulty. The patient tolerated the procedure  well. The quality of the bowel preparation was                            evaluated using the BBPS [facility] Bowel Preparation                            Scale) with scores of: Right Colon = 2 (minor                            amount of residual staining, small fragments of                            stool and/or opaque liquid, but mucosa seen  well),                            Transverse Colon = 3 (entire mucosa seen well with                            no residual staining, small fragments of stool or                            opaque liquid) and Left Colon = 3 (entire mucosa                            seen well with no residual staining, small                            fragments of stool or opaque liquid). The total                            BBPS score equals 8. The quality of the bowel                            preparation was good. Scope In: 9:26:23 AM Scope Out: 9:45:44 AM Scope Withdrawal Time: 0 hours 15 minutes 24 seconds  Total Procedure Duration: 0 hours 19 minutes 21 seconds  Findings:      The perianal and digital rectal examinations were normal.      Non-bleeding internal hemorrhoids were found during endoscopy.      Multiple small and large-mouthed diverticula were found in the sigmoid       colon and descending colon.      Three sessile polyps were found in the ascending colon. The polyps were       5 to 8 mm in size. These polyps were removed with a cold snare.       Resection and retrieval were complete.      A 1 mm polyp was found in the ascending colon. The polyp was sessile.       The polyp was removed with a cold biopsy forceps. Resection and       retrieval were complete.      Biopsies for histology were taken with a cold forceps from the ascending       colon, transverse colon and descending colon for evaluation of  microscopic colitis.      The terminal ileum appeared normal. Impression:               - Non-bleeding internal hemorrhoids.                           - Diverticulosis in the sigmoid colon and in the                            descending colon.                           - Three 5 to 8 mm polyps in the ascending colon,                            removed with a cold snare. Resected and retrieved.                           - One 1 mm polyp in the ascending colon, removed                             with a cold biopsy forceps. Resected and retrieved.                           - The examined portion of the ileum was normal.                           - Biopsies were taken with a cold forceps from the                            ascending colon, transverse colon and descending                            colon for evaluation of microscopic colitis. Moderate Sedation:      Per Anesthesia Care Recommendation:           - Patient has a contact number available for                            emergencies. The signs and symptoms of potential                            delayed complications were discussed with the                            patient. Return to normal activities tomorrow.                            Written discharge instructions were provided to the                            patient.                           - Resume previous diet.                           -  Continue present medications.                           - Await pathology results.                           - Repeat colonoscopy in 5 years for surveillance.                           - Return to GI clinic in 2 months with [name]                            if still having issues. Procedure Code(s):        --- Professional ---                           [phone], Colonoscopy, flexible; with removal of                            tumor(s), polyp(s), or other lesion(s) by snare                            technique                           45380, 59, Colonoscopy, flexible; with biopsy,                            single or multiple Diagnosis Code(s):        --- Professional ---                           K64.8, Other hemorrhoids                           K63.5, Polyp of colon                           R10.31, Right lower quadrant pain                           K57.30, Diverticulosis of large intestine without                            perforation or abscess without bleeding CPT copyright 2019 American Medical  Association. All rights reserved. The codes documented in this report are preliminary and upon coder review may  be revised to meet current compliance requirements. Elon Alas. Abbey Chatters, DO [place] Abbey Chatters, DO 03/24/2020 9:50:59 AM This report has been signed electronically. Number of Addenda: 0

## 2020-03-24 NOTE — H&P (Signed)
Primary Care Physician:  Dorothyann Peng, FNP Primary Gastroenterologist:  Dr. Abbey Chatters  Pre-Procedure History & Physical: HPI:  Krystal Morris is a 69 y.o. female is here for a colonoscopy for Right lower quadrant pain and diarrhea.  Patient denies any family history of colorectal cancer.  No melena or hematochezia.  No unintentional weight loss.  Recent CT imaging showed diverticulosis.  Past Medical History:  Diagnosis Date  . Anxiety   . Diverticulosis    Mild AC simple adenoma small interal hemorrhoids moderate-sized HH mild gastritis and duodenitis  . GERD (gastroesophageal reflux disease)    Epigastric pain  . Irritable bowel syndrome   . Ulcer     Past Surgical History:  Procedure Laterality Date  . ABDOMINAL HYSTERECTOMY    . CARDIAC SURGERY    . CATARACT EXTRACTION W/PHACO Right 05/01/2018   Procedure: CATARACT EXTRACTION PHACO AND INTRAOCULAR LENS PLACEMENT (IOC);  Surgeon: Tonny Branch, MD;  Location: [facility];  Service: Ophthalmology;  Laterality: Right;  CDE: 11.14  . CATARACT EXTRACTION W/PHACO Left 05/15/2018   Procedure: CATARACT EXTRACTION PHACO AND INTRAOCULAR LENS PLACEMENT (IOC);  Surgeon: Tonny Branch, MD;  Location: [facility];  Service: Ophthalmology;  Laterality: Left;  CDE: 12.79  . COLONOSCOPY  02/12/2008   Fields-Simple adenoma. Small int hemorrhoids  . COLONOSCOPY  01/28/2012   Formed stool in MID-TRANSVERSE colon Mild diverticulosis was noted in the sigmoid colon.  , and Moderate sized hemorrhoids were found  . COLONOSCOPY  02/22/2012   Three sessile polyps ranging between 3-63mm in size were found in the descending colon (tubular adenomas); polypectomy was performed/Mild diverticulosis was noted in the sigmoid colon Moderate sized internal hemorrhoids-CAUSING RECTAL BLEEDING-S/P HEMORRHOID BANDINGx2. recommended 10 year f/u tcs  . ESOPHAGOGASTRODUODENOSCOPY  02/12/2008   Fields- Nl duodenum/chronic active gastritis  . FLEXIBLE SIGMOIDOSCOPY N/A 08/13/2019   moderate  external hemorrhoids, small internal hemorrhoids, moderate diverticulosis in rectosigmoid and sigmoid colon.   Marland Kitchen PARTIAL HYSTERECTOMY    . Thyroid Growth     Removal  . TUBAL LIGATION    . UMBILICAL HERNIA REPAIR      Prior to Admission medications   Medication Sig Start Date End Date Taking? Authorizing Provider  acetaminophen (TYLENOL) 500 MG tablet Take 1,000 mg by mouth every 6 (six) hours as needed for headache.   Yes [provider]  amLODipine (NORVASC) 2.5 MG tablet Take 2.5 mg by mouth daily. 03/07/20  Yes [provider]  Multiple Vitamins-Minerals (MULTIVITAMIN WITH MINERALS) tablet Take 1 tablet by mouth 2 (two) times a week.   Yes [provider]  dicyclomine (BENTYL) 10 MG capsule Take 1 capsule (10 mg total) by mouth 3 (three) times daily before meals. As needed for abdominal cramping Patient not taking: Reported on 03/18/2020 02/06/20   Annitta Needs, NP    Allergies as of 03/05/2020 - Review Complete 02/06/2020  Allergen Reaction Noted  . Penicillins Rash     Family History  Problem Relation Age of Onset  . Cancer Son   . COPD Mother   . COPD Father   . Drug abuse Sister        medication overdose  . Coronary artery disease Son   . Colon polyps Daughter     Social History   Socioeconomic History  . Marital status: Divorced    Spouse name: Not on file  . Number of children: 3  . Years of education: Not on file  . Highest education level: Not on file  Occupational  History  . Occupation: Barrister's clerk: [employer]  Tobacco Use  . Smoking status: Never Smoker  . Smokeless tobacco: Never Used  . Tobacco comment: Never smoked  Vaping Use  . Vaping Use: Never used  Substance and Sexual Activity  . Alcohol use: No    Alcohol/week: 0.0 standard drinks  . Drug use: No  . Sexual activity: Not Currently    Birth control/protection: Surgical  Other Topics Concern  . Not on file  Social History Narrative   Lost 1 son, 3  living   Social Determinants of Health   Financial Resource Strain:   . Difficulty of Paying Living Expenses: Not on file  Food Insecurity:   . Worried About Charity fundraiser in the Last Year: Not on file  . Ran Out of Food in the Last Year: Not on file  Transportation Needs:   . Lack of Transportation (Medical): Not on file  . Lack of Transportation (Non-Medical): Not on file  Physical Activity:   . Days of Exercise per Week: Not on file  . Minutes of Exercise per Session: Not on file  Stress:   . Feeling of Stress : Not on file  Social Connections:   . Frequency of Communication with Friends and Family: Not on file  . Frequency of Social Gatherings with Friends and Family: Not on file  . Attends Religious Services: Not on file  . Active Member of Clubs or Organizations: Not on file  . Attends Archivist Meetings: Not on file  . Marital Status: Not on file  Intimate Partner Violence:   . Fear of Current or Ex-Partner: Not on file  . Emotionally Abused: Not on file  . Physically Abused: Not on file  . Sexually Abused: Not on file    Review of Systems: See HPI, otherwise negative ROS  Impression/Plan: Krystal Morris is here for a colonoscopy to be performed for RLQ pain and diarrhea.  The risks of the procedure including infection, bleed, or perforation as well as benefits, limitations, alternatives and imponderables have been reviewed with the patient. Questions have been answered. All parties agreeable.

## 2020-03-25 LAB — SURGICAL PATHOLOGY

## 2020-03-28 ENCOUNTER — Encounter (HOSPITAL_COMMUNITY): Payer: Self-pay | Admitting: Internal Medicine

## 2020-05-07 ENCOUNTER — Ambulatory Visit: Payer: Medicare Other | Admitting: Gastroenterology

## 2020-05-07 ENCOUNTER — Encounter: Payer: Self-pay | Admitting: Gastroenterology

## 2020-05-07 ENCOUNTER — Other Ambulatory Visit: Payer: Self-pay

## 2020-05-07 VITALS — BP 139/80 | HR 77 | Temp 97.1°F | Ht 67.0 in | Wt 166.0 lb

## 2020-05-07 DIAGNOSIS — R399 Unspecified symptoms and signs involving the genitourinary system: Secondary | ICD-10-CM

## 2020-05-07 DIAGNOSIS — N3289 Other specified disorders of bladder: Secondary | ICD-10-CM

## 2020-05-07 NOTE — Progress Notes (Signed)
Referring Provider: Dorothyann Peng, FNP Primary Care Physician:  Dorothyann Peng, FNP Primary GI: Dr. Abbey Chatters   Chief Complaint  Patient presents with  . Follow-up    Still having R side abd pain that comes and goes. Stays sore all the time. No N/V. Normal bm.     HPI:   Krystal Morris is a 69 y.o. female presenting today with a history of chronic RLQ pain. CT updated Oct 2021 without evidence for appendicitis. Mild diffuse thickening of urinary bladder suspicious for cystitis was noted. Colonoscopy completed with non-bleeding internal hemorrhoids. Multiple diverticula. Three sessile polyps 5-8 mm in size in ascendign colon. 1 mm polyp in ascending colon that was sessile. Tubular adenomas. Negative colonic biopsies. 5 year surveillance.   Recommended dicyclomine for looser stool and Miralax as needed for constipation. Right now having soft BM twice daily. Not using Miralax or dicyclomine. Sometimes pain improved after BMs, sometimes not. Feels a band-like tightness around lower abdomen wrapping around back. Hurts to walk a times.   Stays sore in lower abdomen all the time. No longer just RLQ. Still with RLQ discomfort though.  If sitting for awhile, then standing up will be sore with walking. Has urinary hesitancy, pressure, then drip after urinating. Going on for awhile. No odor. No cloudy urine. Seeing gynecologist tomorrow at 9 am.   No heavy lifting. CBC and CMP unrevealing Sept 2021.     Past Medical History:  Diagnosis Date  . Anxiety   . Diverticulosis    Mild AC simple adenoma small interal hemorrhoids moderate-sized HH mild gastritis and duodenitis  . GERD (gastroesophageal reflux disease)    Epigastric pain  . Irritable bowel syndrome   . Ulcer     Past Surgical History:  Procedure Laterality Date  . ABDOMINAL HYSTERECTOMY    . BIOPSY  03/24/2020   Procedure: BIOPSY;  Surgeon: Eloise Harman, DO;  [facility];  Service: Endoscopy;;  . CARDIAC  SURGERY    . CATARACT EXTRACTION W/PHACO Right 05/01/2018   Procedure: CATARACT EXTRACTION PHACO AND INTRAOCULAR LENS PLACEMENT (IOC);  Surgeon: Tonny Branch, MD;  Location: [facility];  Service: Ophthalmology;  Laterality: Right;  CDE: 11.14  . CATARACT EXTRACTION W/PHACO Left 05/15/2018   Procedure: CATARACT EXTRACTION PHACO AND INTRAOCULAR LENS PLACEMENT (IOC);  Surgeon: Tonny Branch, MD;  Location: [facility];  Service: Ophthalmology;  Laterality: Left;  CDE: 12.79  . COLONOSCOPY  02/12/2008   Fields-Simple adenoma. Small int hemorrhoids  . COLONOSCOPY  01/28/2012   Formed stool in MID-TRANSVERSE colon Mild diverticulosis was noted in the sigmoid colon.  , and Moderate sized hemorrhoids were found  . COLONOSCOPY  02/22/2012   Three sessile polyps ranging between 3-43mm in size were found in the descending colon (tubular adenomas); polypectomy was performed/Mild diverticulosis was noted in the sigmoid colon Moderate sized internal hemorrhoids-CAUSING RECTAL BLEEDING-S/P HEMORRHOID BANDINGx2. recommended 10 year f/u tcs  . COLONOSCOPY WITH PROPOFOL N/A 03/24/2020   Non-bleeding internal hemorrhoids. Multiple diverticula. Three sessile polyps 5-8 mm in size in ascendign colon. 1 mm polyp in ascending colon that was sessile. Tubular adenomas. Negative colonic biopsies. 5 year surveillance.   . ESOPHAGOGASTRODUODENOSCOPY  02/12/2008   Fields- Nl duodenum/chronic active gastritis  . FLEXIBLE SIGMOIDOSCOPY N/A 08/13/2019   moderate external hemorrhoids, small internal hemorrhoids, moderate diverticulosis in rectosigmoid and sigmoid colon.   Marland Kitchen PARTIAL HYSTERECTOMY    . POLYPECTOMY  03/24/2020   Procedure: POLYPECTOMY;  Surgeon: Hurshel Keys  K, DO;  [facility];  Service: Endoscopy;;  . Thyroid Growth     Removal  . TUBAL LIGATION    . UMBILICAL HERNIA REPAIR      Current Outpatient Medications  Medication Sig Dispense Refill  . acetaminophen (TYLENOL) 500 MG tablet Take 1,000 mg by mouth every 6  (six) hours as needed for headache.    Marland Kitchen amLODipine (NORVASC) 2.5 MG tablet Take 2.5 mg by mouth daily.    . Multiple Vitamins-Minerals (MULTIVITAMIN WITH MINERALS) tablet Take 1 tablet by mouth 2 (two) times a week.     No current facility-administered medications for this visit.    Allergies as of 05/07/2020 - Review Complete 05/07/2020  Allergen Reaction Noted  . Penicillins Rash     Family History  Problem Relation Age of Onset  . Cancer Son   . COPD Mother   . COPD Father   . Drug abuse Sister        medication overdose  . Coronary artery disease Son   . Colon polyps Daughter     Social History   Socioeconomic History  . Marital status: Divorced    Spouse name: Not on file  . Number of children: 3  . Years of education: Not on file  . Highest education level: Not on file  Occupational History  . Occupation: Barrister's clerk: [employer]  Tobacco Use  . Smoking status: Never Smoker  . Smokeless tobacco: Never Used  . Tobacco comment: Never smoked  Vaping Use  . Vaping Use: Never used  Substance and Sexual Activity  . Alcohol use: No    Alcohol/week: 0.0 standard drinks  . Drug use: No  . Sexual activity: Not Currently    Birth control/protection: Surgical  Other Topics Concern  . Not on file  Social History Narrative   Lost 1 son, 3 living   Social Determinants of Health   Financial Resource Strain: Not on file  Food Insecurity: Not on file  Transportation Needs: Not on file  Physical Activity: Not on file  Stress: Not on file  Social Connections: Not on file    Review of Systems: Gen: Denies fever, chills, anorexia. Denies fatigue, weakness, weight loss.  CV: Denies chest pain, palpitations, syncope, peripheral edema, and claudication. Resp: Denies dyspnea at rest, cough, wheezing, coughing up blood, and pleurisy. GI: see HPI Derm: Denies rash, itching, dry skin Psych: Denies depression, anxiety, memory loss, confusion. No homicidal or  suicidal ideation.  Heme: Denies bruising, bleeding, and enlarged lymph nodes.  Physical Exam: BP 139/80   Pulse 77   Temp (!) 97.1 F (36.2 C) (Temporal)   Ht 5\' 7"  (1.702 m)   Wt 166 lb (75.3 kg)   BMI 26.00 kg/m  General:   Alert and oriented. No distress noted. Pleasant and cooperative.  Head:  Normocephalic and atraumatic. Eyes:  Conjuctiva clear without scleral icterus. Mouth:  Mask in place Abdomen:  +BS, soft, mildly TTP lower abdomen and non-distended. No rebound or guarding. No HSM or masses noted. Msk:  Symmetrical without gross deformities. Normal posture. Extremities:  Without edema. Neurologic:  Alert and  oriented x4 Psych:  Alert and cooperative. Normal mood and affect.  ASSESSMENT/PLAN: Krystal Morris is a 69 y.o. female presenting today with history of chronic RLQ pain, now with lower abdominal pain as well.  Prior evaluation with negative CT Oct 2021 except for mild diffuse thickening of urinary bladder. No urinary symptoms at that time. Colonoscopy  with polyps and surveillance needed in 5 years. CBC and CMP unrevealing.  Now noting urinary symptoms with hesitancy, pressure, incomplete. Movement exacerbating lower abdominal discomfort that feels like a "band" around her lower abdomen. RLQ discomfort still present but now pain is extending across lower abdomen. Notably, movement worsens.   I'm not convinced this is all GI related. Due to bladder wall thickening, I have ordered a dedicated bladder ultrasound, UA with culture, and further evaluation with PCP. Keep appt with GYN tomorrow. May need Urology referral.   Further recommendations to follow once UA and ultrasound reviewed.   Annitta Needs, PhD, ANP-BC [facility]

## 2020-05-07 NOTE — Patient Instructions (Signed)
We are arranging a specialized ultrasound to just look at your bladder. Please complete the urinalysis today.  I recommend follow-up with your primary care to discuss non-GI sources of your discomfort as well.  Further recommendations to follow!   I enjoyed seeing you again today! As you know, I value our relationship and want to provide genuine, compassionate, and quality care. I welcome your feedback. If you receive a survey regarding your visit,  I greatly appreciate you taking time to fill this out. See you next time!  Annitta Needs, PhD, ANP-BC [facility]

## 2020-05-08 NOTE — Progress Notes (Signed)
CC'ED TO PCP 

## 2020-05-12 ENCOUNTER — Telehealth: Payer: Self-pay

## 2020-05-12 ENCOUNTER — Other Ambulatory Visit: Payer: Self-pay

## 2020-05-12 DIAGNOSIS — N3289 Other specified disorders of bladder: Secondary | ICD-10-CM

## 2020-05-12 NOTE — Telephone Encounter (Signed)
Spoke to Collin at [facility] radiology, [place] for bladder wall thickening he advised to order US pelvis limited and place in comment what we're looking for.  US pelvis limited scheduled for 05/15/20 at 12:30pm, arrive at 12:15pm. Arrive with full bladder.  Tried to call pt, left detailed message on answering machine to inform her of [place] appt. Letter mailed.

## 2020-05-15 ENCOUNTER — Ambulatory Visit (HOSPITAL_COMMUNITY): Payer: Medicare Other | Attending: Gastroenterology

## 2020-05-17 LAB — URINALYSIS W MICROSCOPIC + REFLEX CULTURE
Bacteria, UA: NONE SEEN /HPF
Bilirubin Urine: NEGATIVE
Glucose, UA: NEGATIVE
Hgb urine dipstick: NEGATIVE
Hyaline Cast: NONE SEEN /LPF
Ketones, ur: NEGATIVE
Nitrites, Initial: NEGATIVE
Protein, ur: NEGATIVE
Specific Gravity, Urine: 1.024 (ref 1.001–1.03)
pH: 5.5 (ref 5.0–8.0)

## 2020-05-17 LAB — URINE CULTURE
MICRO NUMBER:: 11353124
Result:: NO GROWTH
SPECIMEN QUALITY:: ADEQUATE

## 2020-05-17 LAB — CULTURE INDICATED

## 2020-05-19 ENCOUNTER — Telehealth: Payer: Self-pay | Admitting: Internal Medicine

## 2020-05-19 NOTE — Telephone Encounter (Signed)
No PA is rquired

## 2020-05-19 NOTE — Telephone Encounter (Signed)
patient called because she reschedule her ultrasound and she was told to call here and make sure her Josem Kaufmann is still valid

## 2020-05-26 ENCOUNTER — Telehealth: Payer: Self-pay | Admitting: Internal Medicine

## 2020-05-26 ENCOUNTER — Ambulatory Visit (HOSPITAL_COMMUNITY): Payer: Medicare Other

## 2020-05-26 NOTE — Telephone Encounter (Signed)
PLEASE CALL PATIENT ABOUT HER ULTRASOUND,  SHE SIS NOT HAVE IT AGAIN AND SAID SHE NEEDS TO FIND SOMEONE "IN NETWORK" ?

## 2020-05-27 NOTE — Telephone Encounter (Signed)
Patient states she no longer has [facility] Medicare. She now has [employer]. Reports ID # [id number]

## 2020-12-05 IMAGING — CT CT ABD-PELV W/ CM
2 of 5 series · 17 of 46 positions shown, 19 images · IV contrast (Omnipaque or Isovue)
Comparison: 05/24/2019

CLINICAL DATA: Chronic right lower quadrant pain, tenderness, and
diarrhea.

EXAM:
CT ABDOMEN AND PELVIS WITH CONTRAST
TECHNIQUE: Multidetector CT imaging of the abdomen and pelvis was performed
using the standard protocol following bolus administration of
intravenous contrast.
CONTRAST:  100mL OMNIPAQUE IOHEXOL 300 MG/ML  SOLN

[Series 2: axial st · axial · 0.86mm/px · z∈[+812,+1192]mm · 14 of 88 slices shown, 16 images]
[im 6/88  soft-tissue]
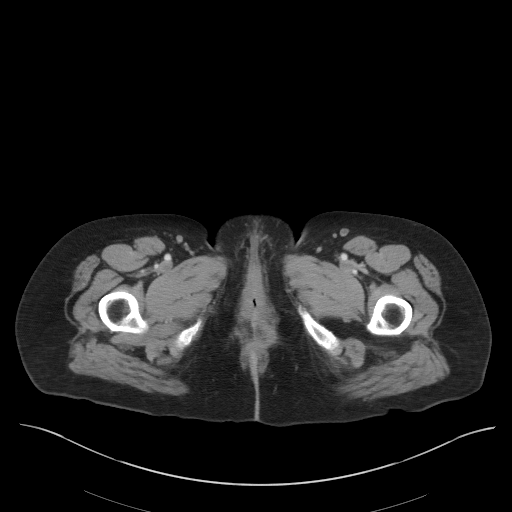
[im 6/88  bone]
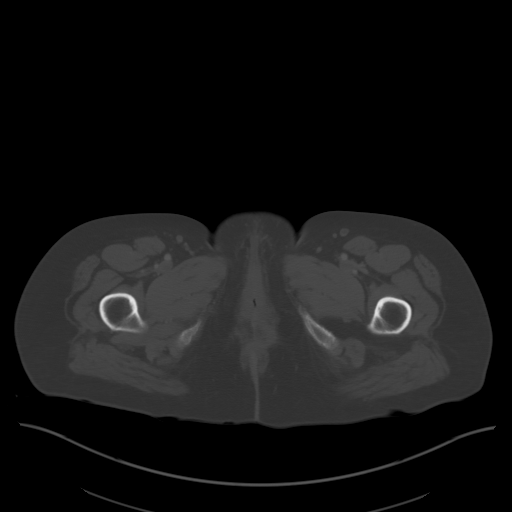
[im 11/88  soft-tissue]
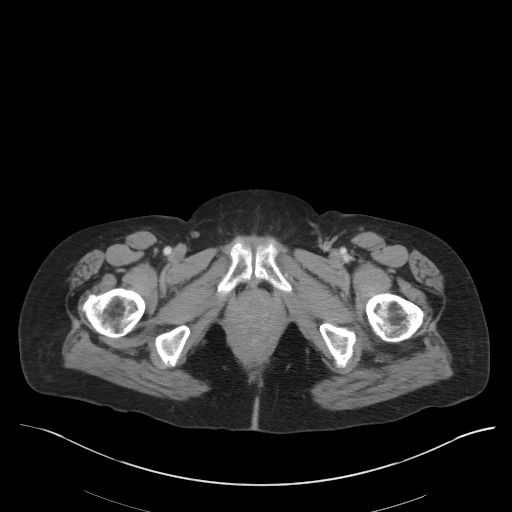
[im 16/88  soft-tissue]
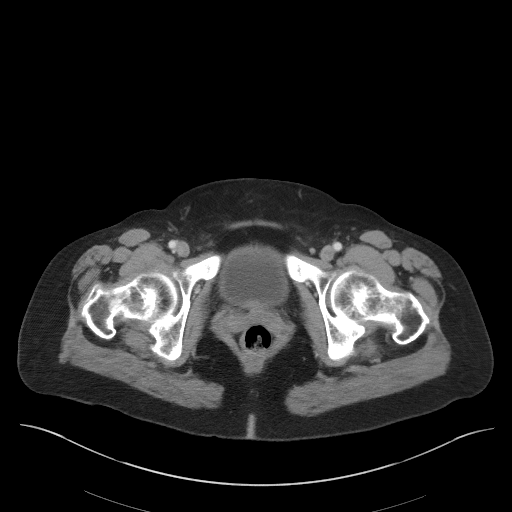
[im 26/88  soft-tissue]
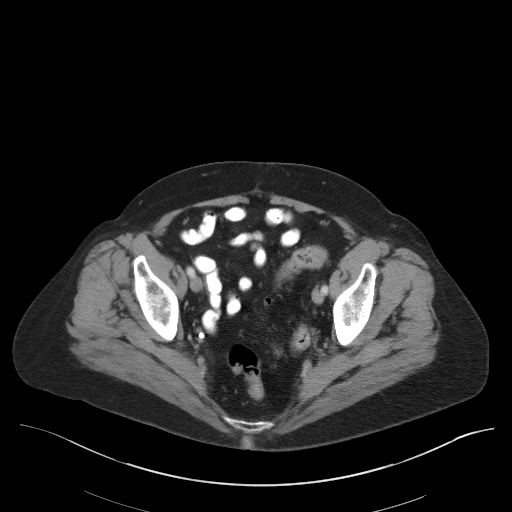
[im 31/88  soft-tissue]
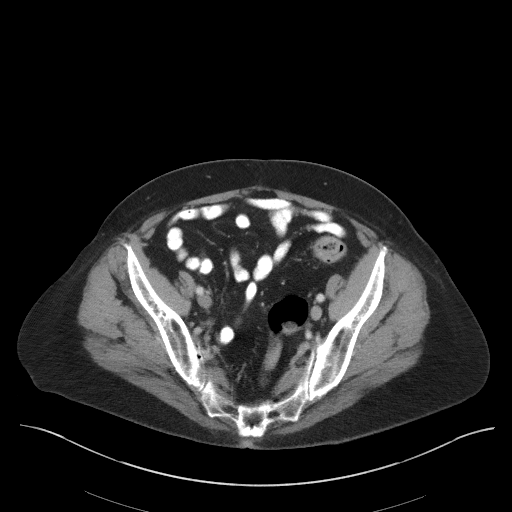
[im 36/88  soft-tissue]
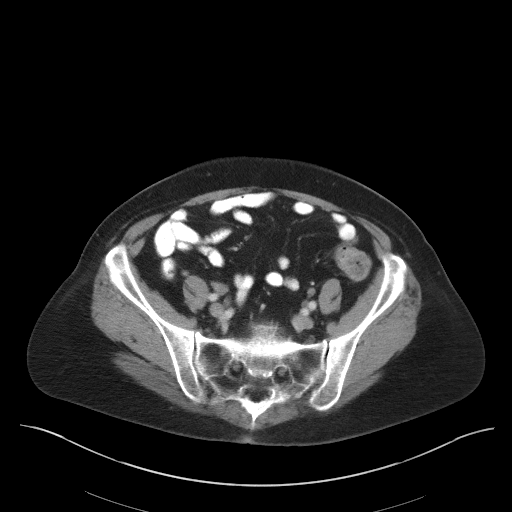
[im 41/88  soft-tissue]
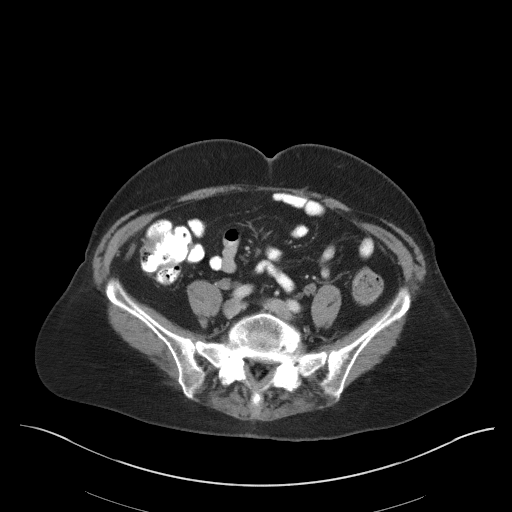
[im 47/88  soft-tissue]
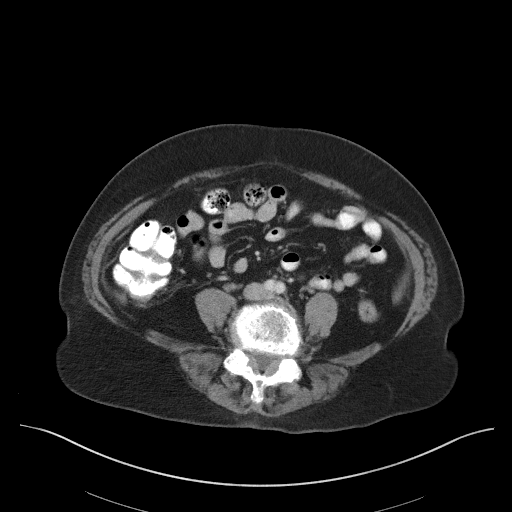
[im 52/88  soft-tissue]
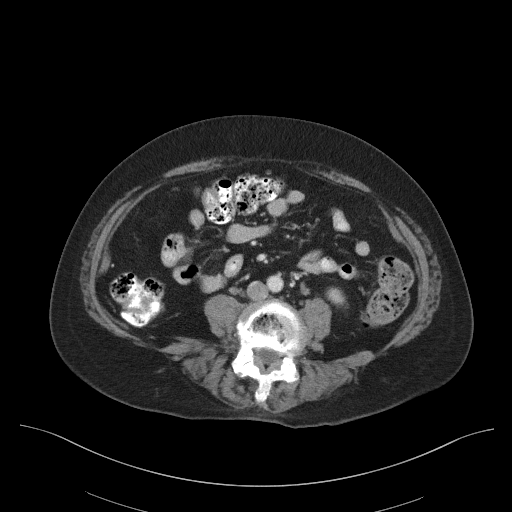
[im 52/88  bone]
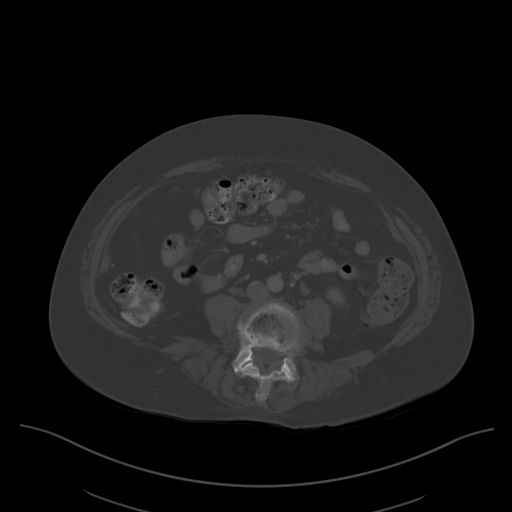
[im 57/88  soft-tissue]
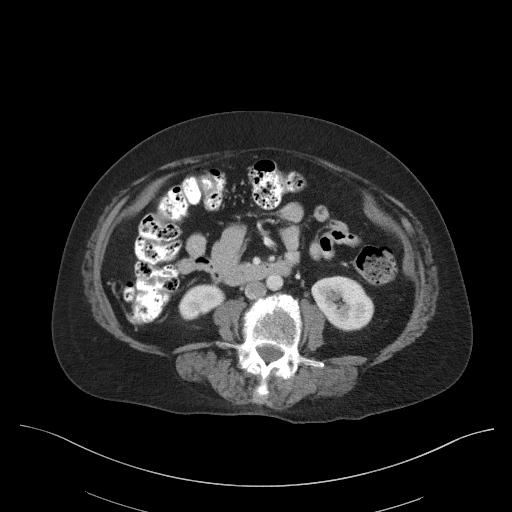
[im 67/88  soft-tissue]
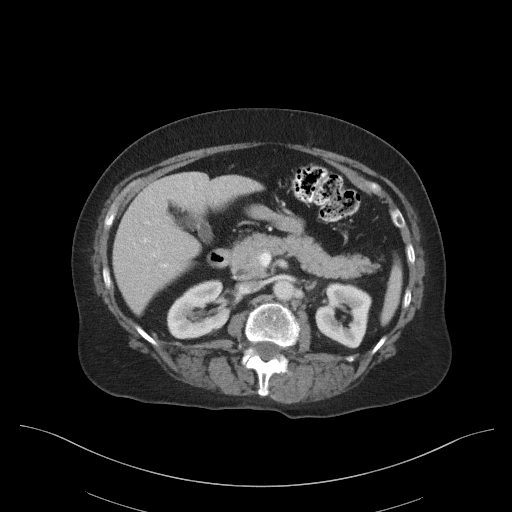
[im 72/88  soft-tissue]
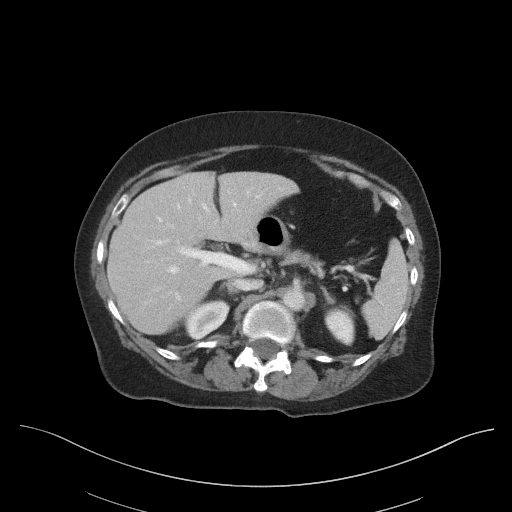
[im 77/88  soft-tissue]
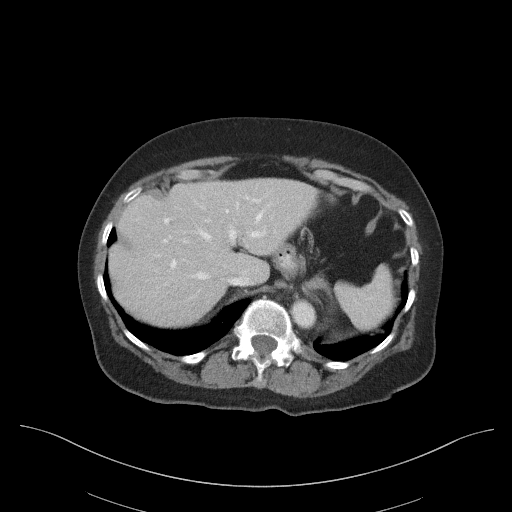
[im 82/88  soft-tissue]
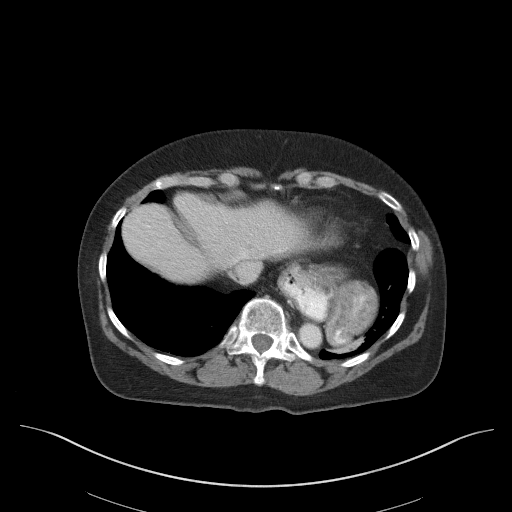

[Series 6: coronal st · coronal · 0.81mm/px · 3 of 105 slices shown]
[im 35/105  soft-tissue]
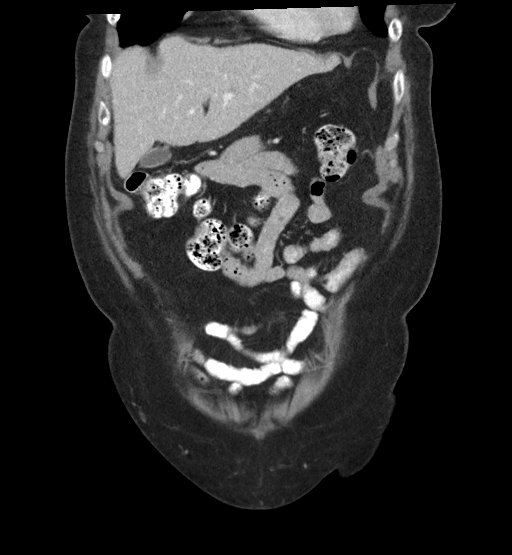
[im 47/105  soft-tissue]
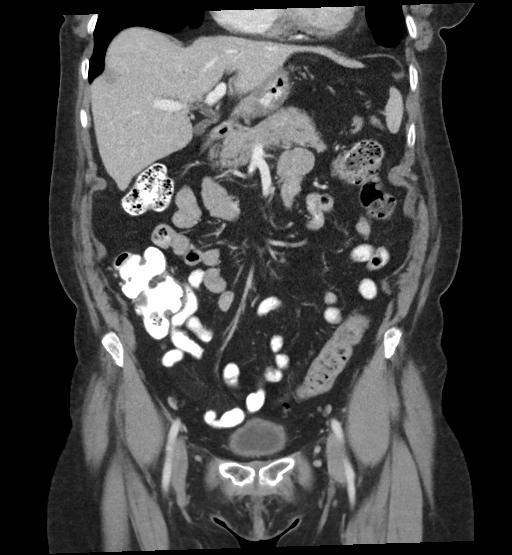
[im 58/105  soft-tissue]
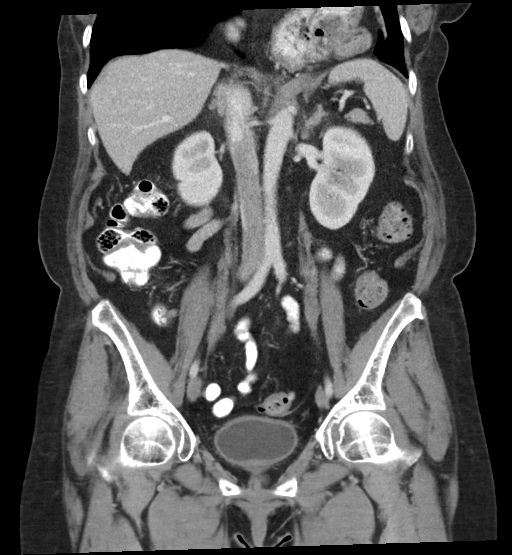

[17 of 46 positions shown; findings below may reference images not displayed]

FINDINGS: Lower Chest: No acute findings.

Hepatobiliary: No hepatic masses identified. Gallbladder is
unremarkable. No evidence of biliary ductal dilatation.

Pancreas:  No mass or inflammatory changes.

Spleen: Within normal limits in size and appearance.

Adrenals/Urinary Tract: No masses identified. No evidence of
ureteral calculi or hydronephrosis. Mild diffuse bladder wall
thickening is seen, suspicious for cystitis.

Stomach/Bowel: Large hiatal hernia is again seen. No evidence of
obstruction, inflammatory process or abnormal fluid collections.
Normal appendix visualized. Diverticulosis is seen mainly involving
the sigmoid colon, however there is no evidence of diverticulitis.

Vascular/Lymphatic: No pathologically enlarged lymph nodes. No
abdominal aortic aneurysm.

Reproductive: Prior hysterectomy noted. Adnexal regions are
unremarkable in appearance.

Other:  None.

Musculoskeletal:  No suspicious bone lesions identified.
IMPRESSION: No evidence of appendicitis.

Mild diffuse wall thickening of urinary bladder, suspicious for
cystitis.

Stable large hiatal hernia.

Sigmoid diverticulosis, without radiographic evidence of
diverticulitis.

## 2021-01-14 ENCOUNTER — Ambulatory Visit (INDEPENDENT_AMBULATORY_CARE_PROVIDER_SITE_OTHER): Payer: Medicare Other | Admitting: Internal Medicine

## 2021-01-14 ENCOUNTER — Encounter: Payer: Self-pay | Admitting: Internal Medicine

## 2021-01-14 ENCOUNTER — Other Ambulatory Visit: Payer: Self-pay

## 2021-01-14 VITALS — BP 145/82 | HR 89 | Temp 97.5°F | Ht 67.0 in | Wt 163.6 lb

## 2021-01-14 DIAGNOSIS — R1032 Left lower quadrant pain: Secondary | ICD-10-CM

## 2021-01-14 DIAGNOSIS — K5989 Other specified functional intestinal disorders: Secondary | ICD-10-CM | POA: Diagnosis not present

## 2021-01-14 DIAGNOSIS — R1031 Right lower quadrant pain: Secondary | ICD-10-CM

## 2021-01-14 MED ORDER — AMITRIPTYLINE HCL 10 MG PO TABS: 10.0000 mg | ORAL_TABLET | Freq: Every day | ORAL | 5 refills | Status: DC

## 2021-01-14 NOTE — Progress Notes (Signed)
Primary Care Physician:  Dorothyann Peng, FNP Primary Gastroenterologist:  Dr. Abbey Chatters  Chief Complaint  Patient presents with   Abdominal Pain    Llq into RLQ and radiates into back, constant, feels sore    HPI:   Krystal Morris is a 70 y.o. female who presents to the clinic today for follow-up visit.  She has a history of IBS as well as chronic abdominal pain.  Symptoms primarily right lower and left lower quadrants.  She had a CT abdomen pelvis October 2021 which was relatively unremarkable besides diverticulosis.  She underwent colonoscopy November 2021 which was also relatively unremarkable besides diverticulosis and multiple small tubular adenomas removed with a 5-year recall.  Random colon biopsies were negative.  She was trialed on dicyclomine and states he took this for 3 to 4 weeks without any improvement in her symptoms.   Today she states she is the same.  Continues to have pain on a regular basis.  Does note an acute episode approximate 1 month ago that was more severe than prior.  Also had associated diarrhea during this episode as well.  Her diarrhea has resolved currently.  States she has regular bowel movements daily.  Denies any constipation for me today though apparently this was an issue in the past.  She is status post hysterectomy.  Past Medical History:  Diagnosis Date   Anxiety    Diverticulosis    Mild AC simple adenoma small interal hemorrhoids moderate-sized HH mild gastritis and duodenitis   GERD (gastroesophageal reflux disease)    Epigastric pain   Irritable bowel syndrome    Ulcer     Past Surgical History:  Procedure Laterality Date   ABDOMINAL HYSTERECTOMY     BIOPSY  03/24/2020   Procedure: BIOPSY;  Surgeon: Eloise Harman, DO;  [facility];  Service: Endoscopy;;   CARDIAC SURGERY     CATARACT EXTRACTION W/PHACO Right 05/01/2018   Procedure: CATARACT EXTRACTION PHACO AND INTRAOCULAR LENS PLACEMENT ([place]);  Surgeon: Tonny Branch, MD;   Location: [facility];  Service: Ophthalmology;  Laterality: Right;  CDE: 11.14   CATARACT EXTRACTION W/PHACO Left 05/15/2018   Procedure: CATARACT EXTRACTION PHACO AND INTRAOCULAR LENS PLACEMENT (IOC);  Surgeon: Tonny Branch, MD;  Location: [facility];  Service: Ophthalmology;  Laterality: Left;  CDE: 12.79   COLONOSCOPY  02/12/2008   Fields-Simple adenoma. Small int hemorrhoids   COLONOSCOPY  01/28/2012   Formed stool in MID-TRANSVERSE colon Mild diverticulosis was noted in the sigmoid colon.  , and Moderate sized hemorrhoids were found   COLONOSCOPY  02/22/2012   Three sessile polyps ranging between 3-53m in size were found in the descending colon (tubular adenomas); polypectomy was performed/Mild diverticulosis was noted in the sigmoid colon Moderate sized internal hemorrhoids-CAUSING RECTAL BLEEDING-S/P HEMORRHOID BANDINGx2. recommended 10 year f/u tcs   COLONOSCOPY WITH PROPOFOL N/A 03/24/2020   Non-bleeding internal hemorrhoids. Multiple diverticula. Three sessile polyps 5-8 mm in size in ascendign colon. 1 mm polyp in ascending colon that was sessile. Tubular adenomas. Negative colonic biopsies. 5 year surveillance.    ESOPHAGOGASTRODUODENOSCOPY  02/12/2008   Fields- Nl duodenum/chronic active gastritis   FLEXIBLE SIGMOIDOSCOPY N/A 08/13/2019   moderate external hemorrhoids, small internal hemorrhoids, moderate diverticulosis in rectosigmoid and sigmoid colon.    PARTIAL HYSTERECTOMY     POLYPECTOMY  03/24/2020   Procedure: POLYPECTOMY;  Surgeon: CEloise Harman DO;  [facility];  Service: Endoscopy;;   Thyroid Growth     Removal  TUBAL LIGATION     UMBILICAL HERNIA REPAIR      Current Outpatient Medications  Medication Sig Dispense Refill   acetaminophen (TYLENOL) 500 MG tablet Take 1,000 mg by mouth every 6 (six) hours as needed for headache.     amitriptyline (ELAVIL) 10 MG tablet Take 1 tablet (10 mg total) by mouth at bedtime. 30 tablet 5   amLODipine (NORVASC) 2.5 MG tablet  Take 2.5 mg by mouth daily.     Multiple Vitamins-Minerals (MULTIVITAMIN WITH MINERALS) tablet Take 1 tablet by mouth 2 (two) times a week.     No current facility-administered medications for this visit.    Allergies as of 01/14/2021 - Review Complete 01/14/2021  Allergen Reaction Noted   Penicillins Rash     Family History  Problem Relation Age of Onset   Cancer Son    COPD Mother    COPD Father    Drug abuse Sister        medication overdose   Coronary artery disease Son    Colon polyps Daughter     Social History   Socioeconomic History   Marital status: Divorced    Spouse name: Not on file   Number of children: 3   Years of education: Not on file   Highest education level: Not on file  Occupational History   Occupation: Barrister's clerk: [employer]  Tobacco Use   Smoking status: Never   Smokeless tobacco: Never   Tobacco comments:    Never smoked  Vaping Use   Vaping Use: Never used  Substance and Sexual Activity   Alcohol use: No    Alcohol/week: 0.0 standard drinks   Drug use: No   Sexual activity: Not Currently    Birth control/protection: Surgical  Other Topics Concern   Not on file  Social History Narrative   Lost 1 son, 3 living   Social Determinants of Health   Financial Resource Strain: Not on file  Food Insecurity: Not on file  Transportation Needs: Not on file  Physical Activity: Not on file  Stress: Not on file  Social Connections: Not on file  Intimate Partner Violence: Not on file    Subjective: Review of Systems  Constitutional:  Negative for chills and fever.  HENT:  Negative for congestion and hearing loss.   Eyes:  Negative for blurred vision and double vision.  Respiratory:  Negative for cough and shortness of breath.   Cardiovascular:  Negative for chest pain and palpitations.  Gastrointestinal:  Positive for abdominal pain. Negative for blood in stool, constipation, diarrhea, heartburn, melena and vomiting.   Genitourinary:  Negative for dysuria and urgency.  Musculoskeletal:  Negative for joint pain and myalgias.  Skin:  Negative for itching and rash.  Neurological:  Negative for dizziness and headaches.  Psychiatric/Behavioral:  Negative for depression. The patient is not nervous/anxious.       Objective: BP (!) 145/82   Pulse 89   Temp (!) 97.5 F (36.4 C)   Ht '5\' 7"'$  (1.702 m)   Wt 163 lb 9.6 oz (74.2 kg)   BMI 25.62 kg/m  Physical Exam Constitutional:      Appearance: Normal appearance.  HENT:     Head: Normocephalic and atraumatic.  Eyes:     Extraocular Movements: Extraocular movements intact.     Conjunctiva/sclera: Conjunctivae normal.  Cardiovascular:     Rate and Rhythm: Normal rate and regular rhythm.  Pulmonary:     Effort: Pulmonary effort is normal.  Breath sounds: Normal breath sounds.  Abdominal:     General: Bowel sounds are normal.     Palpations: Abdomen is soft.     Tenderness: There is abdominal tenderness in the right lower quadrant and left lower quadrant.  Musculoskeletal:        General: No swelling. Normal range of motion.     Cervical back: Normal range of motion and neck supple.  Skin:    General: Skin is warm and dry.     Coloration: Skin is not jaundiced.  Neurological:     General: No focal deficit present.     Mental Status: She is alert and oriented to person, place, and time.  Psychiatric:        Mood and Affect: Mood normal.        Behavior: Behavior normal.     Assessment: *Abdominal pain-chronic, right lower quadrant/left lower quadrant *Visceral hypersensitivity *Irritable bowel  Plan: Etiology of patient's abdominal pain remains elusive.  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  She is status post hysterectomy so adhesions could also be playing a role.  I will trial her on amitriptyline 10 mg nightly.  Counseled that we can increase to 25 mg if needed.  Counseled on side effect profile  and she is in agreement.  Dicyclomine did not help, trial this for nearly 4 weeks without improvement.  I have recommended she add over-the-counter Benefiber or Metamucil.  If she has any acute "flare" of her symptoms with worsening pain and diarrhea, she will call office and we will order CT imaging to rule out diverticulitis.  Otherwise follow-up in 3 months.  01/14/2021 12:10 PM   Disclaimer: This note was dictated with voice recognition software. Similar sounding words can inadvertently be transcribed and may not be corrected upon review.

## 2021-01-14 NOTE — Patient Instructions (Signed)
I am going to start you on a new medication called amitriptyline.  This will be 10 mg nightly.  Hopefully this will help with your chronic abdominal pain.  We have room to go up on dosage to 25 mg if needed.  Please trial this for 4 to 6 weeks and if no improvement then we will increase the dosage.    I want you to add over-the-counter Benefiber or Metamucil to your regimen as well.  If you have an acute episode of worsening pain and diarrhea, please call our office and we will order a CT scan to be performed to rule out diverticulitis.  Follow-up in 3 months.  It was great seeing you again today.  Dr. Abbey Chatters  At [facility] we value your feedback. You may receive a survey about your visit today. Please share your experience as we strive to create trusting relationships with our patients to provide genuine, compassionate, quality care.  We appreciate your understanding and patience as we review any laboratory studies, imaging, and other diagnostic tests that are ordered as we care for you. Our office policy is 5 business days for review of these results, and any emergent or urgent results are addressed in a timely manner for your best interest. If you do not hear from our office in 1 week, please contact us.   We also encourage the use of MyChart, which contains your medical information for your review as well. If you are not enrolled in this feature, an access code is on this after visit summary for your convenience. Thank you for allowing [place] to be involved in your care.  It was great to see you today!  I hope you have a great rest of your summer!!    Krystal Morris. Abbey Chatters, D.O. Gastroenterology and Hepatology [facility]

## 2021-02-08 ENCOUNTER — Encounter (HOSPITAL_COMMUNITY): Payer: Self-pay | Admitting: *Deleted

## 2021-02-08 ENCOUNTER — Emergency Department (HOSPITAL_COMMUNITY): Payer: Medicare Other

## 2021-02-08 ENCOUNTER — Observation Stay (HOSPITAL_COMMUNITY)
Admission: EM | Admit: 2021-02-08 | Discharge: 2021-02-09 | Disposition: A | Discharge status: Home / Self Care | Payer: Medicare Other | Attending: Family Medicine | Admitting: Family Medicine

## 2021-02-08 ENCOUNTER — Other Ambulatory Visit: Payer: Self-pay

## 2021-02-08 DIAGNOSIS — G9341 Metabolic encephalopathy: Secondary | ICD-10-CM | POA: Diagnosis not present

## 2021-02-08 DIAGNOSIS — R109 Unspecified abdominal pain: Secondary | ICD-10-CM | POA: Diagnosis not present

## 2021-02-08 DIAGNOSIS — I10 Essential (primary) hypertension: Secondary | ICD-10-CM | POA: Insufficient documentation

## 2021-02-08 DIAGNOSIS — E1159 Type 2 diabetes mellitus with other circulatory complications: Secondary | ICD-10-CM | POA: Diagnosis present

## 2021-02-08 DIAGNOSIS — Z79899 Other long term (current) drug therapy: Secondary | ICD-10-CM | POA: Diagnosis not present

## 2021-02-08 DIAGNOSIS — Z20822 Contact with and (suspected) exposure to covid-19: Secondary | ICD-10-CM | POA: Insufficient documentation

## 2021-02-08 DIAGNOSIS — R2689 Other abnormalities of gait and mobility: Secondary | ICD-10-CM | POA: Insufficient documentation

## 2021-02-08 DIAGNOSIS — E119 Type 2 diabetes mellitus without complications: Secondary | ICD-10-CM | POA: Diagnosis not present

## 2021-02-08 DIAGNOSIS — R4182 Altered mental status, unspecified: Secondary | ICD-10-CM | POA: Diagnosis present

## 2021-02-08 DIAGNOSIS — I152 Hypertension secondary to endocrine disorders: Secondary | ICD-10-CM | POA: Diagnosis present

## 2021-02-08 LAB — DIFFERENTIAL
Abs Immature Granulocytes: 0.01 10*3/uL (ref 0.00–0.07)
Basophils Absolute: 0.1 10*3/uL (ref 0.0–0.1)
Basophils Relative: 1 %
Eosinophils Absolute: 0.1 10*3/uL (ref 0.0–0.5)
Eosinophils Relative: 3 %
Immature Granulocytes: 0 %
Lymphocytes Relative: 33 %
Lymphs Abs: 1.4 10*3/uL (ref 0.7–4.0)
Monocytes Absolute: 0.3 10*3/uL (ref 0.1–1.0)
Monocytes Relative: 8 %
Neutro Abs: 2.4 10*3/uL (ref 1.7–7.7)
Neutrophils Relative %: 55 %

## 2021-02-08 LAB — PROTIME-INR
INR: 0.9 (ref 0.8–1.2)
Prothrombin Time: 12.4 seconds (ref 11.4–15.2)

## 2021-02-08 LAB — CBC
HCT: 42.6 % (ref 36.0–46.0)
Hemoglobin: 13.3 g/dL (ref 12.0–15.0)
MCH: 27.8 pg (ref 26.0–34.0)
MCHC: 31.2 g/dL (ref 30.0–36.0)
MCV: 88.9 fL (ref 80.0–100.0)
Platelets: 247 10*3/uL (ref 150–400)
RBC: 4.79 MIL/uL (ref 3.87–5.11)
RDW: 15.8 % — ABNORMAL HIGH (ref 11.5–15.5)
WBC: 4.3 10*3/uL (ref 4.0–10.5)
nRBC: 0 % (ref 0.0–0.2)

## 2021-02-08 LAB — APTT: aPTT: 28 seconds (ref 24–36)

## 2021-02-08 LAB — CBG MONITORING, ED: Glucose-Capillary: 110 mg/dL — ABNORMAL HIGH (ref 70–99)

## 2021-02-08 NOTE — ED Provider Notes (Addendum)
Tensed Provider Note   CSN: [id number] Arrival date & time: 02/08/21  2256     History Chief Complaint  Patient presents with   Altered Mental Status    Krystal Morris is a 70 y.o. female.  Level 5 caveat for altered mental status.  Patient with history of GERD and hypertension here with speech changes, gait changes, difficulty speaking and difficulty waking up since approximately 6 PM.  Her daughter states she had difficulty waking her up and she had difficulty walking and was falling to the side.  She was talking out of her head speaking about how she needed to go to work tonight and was talking about holding something in her hand even though she was not. Her last seen normal was about 6 PM.  Patient denies any symptoms.  Patient admits to slight headache.  She is oriented to person and place and time.  She denies taking anything is wrong.  She has been eating and drinking normally.  No vomiting, diarrhea, cough or fever.  No runny nose or sore throat.  No chest pain or shortness of breath. Patient had difficulty walking was found to 1 side per her daughter at bedside.  Patient does not think anything is wrong.  Her daughter states she was slow to respond and confused and was "talking out of her head" She was started on amitriptyline about 2 weeks ago for chronic abdominal pain No tongue biting or seizure activity  The history is provided by the patient.  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   RLQ abdominal pain 05/02/2019   Left buttock pain 05/02/2019   Rectal bleeding 01/17/2018   Poor appetite 01/17/2018   Abdominal pain, epigastric 01/02/2015   Odynophagia 01/02/2015   GAD (generalized anxiety disorder) 04/03/2013   Internal hemorrhoids with complication [id number]   IBS 11/18/2009   HIP PAIN, LEFT 09/24/2008   GERD, SEVERE 08/22/2008    Past Surgical History:  Procedure Laterality Date   ABDOMINAL HYSTERECTOMY     BIOPSY  03/24/2020   Procedure: BIOPSY;  Surgeon: Eloise Harman, DO;  Location: [facility];  Service: Endoscopy;;   CARDIAC SURGERY     CATARACT EXTRACTION W/PHACO Right 05/01/2018   Procedure: CATARACT EXTRACTION PHACO AND INTRAOCULAR LENS PLACEMENT ([place]);  Surgeon: Tonny Branch, MD;  Location: [facility];  Service: Ophthalmology;  Laterality: Right;  CDE: 11.14   CATARACT EXTRACTION W/PHACO Left 05/15/2018   Procedure: CATARACT EXTRACTION PHACO AND INTRAOCULAR LENS PLACEMENT ([facility]);  Surgeon: Tonny Branch, MD;  Location: [facility];  Service: Ophthalmology;  Laterality: Left;  CDE: 12.79   COLONOSCOPY  02/12/2008   Fields-Simple adenoma. Small int hemorrhoids   COLONOSCOPY  01/28/2012   Formed stool in MID-TRANSVERSE colon Mild diverticulosis was noted in the sigmoid colon.  , and Moderate sized hemorrhoids were found   COLONOSCOPY  02/22/2012   Three  sessile polyps ranging between 3-54m in size were found in the descending colon (tubular adenomas); polypectomy was performed/Mild diverticulosis was noted in the sigmoid colon Moderate sized internal hemorrhoids-CAUSING RECTAL BLEEDING-S/P HEMORRHOID BANDINGx2. recommended 10 year f/u tcs   COLONOSCOPY WITH PROPOFOL N/A 03/24/2020   Non-bleeding internal hemorrhoids. Multiple diverticula. Three sessile polyps 5-8 mm in size in ascendign colon. 1 mm polyp in ascending colon that was sessile. Tubular adenomas. Negative colonic biopsies. 5 year  surveillance.    ESOPHAGOGASTRODUODENOSCOPY  02/12/2008   Fields- Nl duodenum/chronic active gastritis   FLEXIBLE SIGMOIDOSCOPY N/A 08/13/2019   moderate external hemorrhoids, small internal hemorrhoids, moderate diverticulosis in rectosigmoid and sigmoid colon.    PARTIAL HYSTERECTOMY     POLYPECTOMY  03/24/2020   Procedure: POLYPECTOMY;  Surgeon: CEloise Harman DO;  Location: [facility];  Service: Endoscopy;;   Thyroid Growth     Removal   TUBAL LIGATION     UMBILICAL HERNIA REPAIR       OB History   No obstetric history on file.     Family History  Problem Relation Age of Onset   Cancer Son    COPD Mother    COPD Father    Drug abuse Sister        medication overdose   Coronary artery disease Son    Colon polyps Daughter     Social History   Tobacco Use   Smoking status: Never   Smokeless tobacco: Never   Tobacco comments:    Never smoked  Vaping Use   Vaping Use: Never used  Substance Use Topics   Alcohol use: No    Alcohol/week: 0.0 standard drinks   Drug use: No    Home Medications Prior to Admission medications   Medication Sig Start Date End Date Taking? Authorizing Provider  acetaminophen (TYLENOL) 500 MG tablet Take 1,000 mg by mouth every 6 (six) hours as needed for headache.    [provider]  amitriptyline (ELAVIL) 10 MG tablet Take 1 tablet (10 mg total) by mouth at bedtime. 01/14/21 07/13/21  CEloise Harman DO  amLODipine (NORVASC) 2.5 MG tablet Take 2.5 mg by mouth daily. 03/07/20   [provider]  Multiple Vitamins-Minerals (MULTIVITAMIN WITH MINERALS) tablet Take 1 tablet by mouth 2 (two) times a week.    [provider]    Allergies    Penicillins  Review of Systems   Review of Systems  Constitutional:  Negative for activity change, appetite change and fever.  HENT:  Negative for congestion.   Respiratory:  Negative for cough, chest tightness and shortness of breath.   Cardiovascular:  Negative for  chest pain.  Gastrointestinal:  Negative for abdominal pain, nausea and vomiting.  Genitourinary:  Negative for dysuria and hematuria.  Musculoskeletal:  Negative for arthralgias and myalgias.  Skin:  Negative for rash.  Neurological:  Positive for headaches. Negative for dizziness and weakness.   all other systems are negative except as noted in the HPI and PMH.   Physical Exam Updated Vital Signs BP (!) 152/89 (BP Location: Right Arm)   Pulse 96   Temp 97.7 F (36.5 C) (Oral)   Resp 16   Ht '5\' 7"'$  (1.702 m)   Wt 74.8 kg   SpO2 100%   BMI 25.84 kg/m   Physical Exam Vitals and nursing note reviewed.  Constitutional:      General: She is not in acute distress.    Appearance: She is well-developed.     Comments:  Flat affect, slow to respond, oriented x3.  HENT:     Head: Normocephalic and atraumatic.     Mouth/Throat:     Pharynx: No oropharyngeal exudate.  Eyes:     Conjunctiva/sclera: Conjunctivae normal.     Pupils: Pupils are equal, round, and reactive to light.  Neck:     Comments: No meningismus. Cardiovascular:     Rate and Rhythm: Normal rate and regular rhythm.     Heart sounds: Normal heart sounds. No murmur heard. Pulmonary:     Effort: Pulmonary effort is normal. No respiratory distress.     Breath sounds: Normal breath sounds.  Abdominal:     Palpations: Abdomen is soft.     Tenderness: There is no abdominal tenderness. There is no guarding or rebound.  Musculoskeletal:        General: No tenderness. Normal range of motion.     Cervical back: Normal range of motion and neck supple.  Skin:    General: Skin is warm.  Neurological:     General: No focal deficit present.     Mental Status: She is alert and oriented to person, place, and time. Mental status is at baseline.     Cranial Nerves: No cranial nerve deficit.     Motor: No abnormal muscle tone.     Coordination: Coordination normal.     Comments: CN 2-12 intact, no ataxia on finger to nose, no  nystagmus, 5/5 strength throughout, no pronator drift,  No asterixis  Psychiatric:        Behavior: Behavior normal.    ED Results / Procedures / Treatments   Labs (all labs ordered are listed, but only abnormal results are displayed) Labs Reviewed  CBC - Abnormal; Notable for the following components:      Result Value   RDW 15.8 (*)    All other components within normal limits  COMPREHENSIVE METABOLIC PANEL - Abnormal; Notable for the following components:   Glucose, Bld 110 (*)    Anion gap 4 (*)    All other components within normal limits  BLOOD GAS, VENOUS - Abnormal; Notable for the following components:   pCO2, Ven 42.5 (*)    All other components within normal limits  CBG MONITORING, ED - Abnormal; Notable for the following components:   Glucose-Capillary 110 (*)    All other components within normal limits  RESP PANEL BY RT-PCR (FLU A&B, COVID) ARPGX2  ETHANOL  PROTIME-INR  APTT  DIFFERENTIAL  AMMONIA  TSH  RAPID URINE DRUG SCREEN, HOSP PERFORMED  URINALYSIS, ROUTINE W REFLEX MICROSCOPIC  I-STAT CHEM 8, ED    EKG EKG Interpretation  Date/Time:  Sunday February 08 2021 23:09:46 EDT Ventricular Rate:  91 PR Interval:  168 QRS Duration: 78 QT Interval:  390 QTC Calculation: 479 R Axis:   48 Text Interpretation: Normal sinus rhythm Low voltage QRS Borderline ECG No significant change was found Confirmed by Ezequiel Essex [phone]) on 02/09/2021 1:16:11 AM  Radiology DG Chest Portable 1 View  Result Date: 02/09/2021 CLINICAL DATA:  Altered mental status EXAM: PORTABLE CHEST 1 VIEW COMPARISON:  03/12/2014 FINDINGS: Retrocardiac lucency represents a a moderate hiatal hernia, better seen on CT examination of 02/29/2020. Lungs are otherwise clear. No pneumothorax or pleural effusion. Cardiac size within normal limits. Pulmonary vascularity is normal. No acute bone abnormality. IMPRESSION: No active disease. Moderate hiatal hernia. Electronically Signed   By:  Fidela Salisbury M.D.   On: 02/09/2021 00:09   CT HEAD CODE STROKE WO  CONTRAST  Result Date: 02/09/2021 CLINICAL DATA:  Code stroke. EXAM: CT HEAD WITHOUT CONTRAST TECHNIQUE: Contiguous axial images were obtained from the base of the skull through the vertex without intravenous contrast. COMPARISON:  None. FINDINGS: Brain: There is no mass, hemorrhage or extra-axial collection. The size and configuration of the ventricles and extra-axial CSF spaces are normal. The brain parenchyma is normal, without evidence of acute or chronic infarction. Vascular: No abnormal hyperdensity of the major intracranial arteries or dural venous sinuses. No intracranial atherosclerosis. Skull: The visualized skull base, calvarium and extracranial soft tissues are normal. Sinuses/Orbits: No fluid levels or advanced mucosal thickening of the visualized paranasal sinuses. No mastoid or middle ear effusion. The orbits are normal. ASPECTS [facility] Stroke Program Early CT Score) - Ganglionic level infarction (caudate, lentiform nuclei, internal capsule, insula, M1-M3 cortex): 7 - Supraganglionic infarction (M4-M6 cortex): 3 Total score (0-10 with 10 being normal): 10 IMPRESSION: 1. Normal head CT. 2. ASPECTS is 10. These results were called by telephone at the time of interpretation on 02/09/2021 at 12:07 am to provider Carollee Leitz, who verbally acknowledged these results. Electronically Signed   By: Ulyses Jarred M.D.   On: 02/09/2021 00:08    Procedures Procedures   Medications Ordered in ED Medications - No data to display  ED Course  I have reviewed the triage vital signs and the nursing notes.  Pertinent labs & imaging results that were available during my care of the patient were reviewed by me and considered in my medical decision making (see chart for details).    MDM Rules/Calculators/A&P                          Patient here with altered mental status lasting about 6 PM.  She is slow to respond and confused per  her daughter.  She has difficulty walking.  Her neurological exam has no focal deficits.  Code stroke was activated on arrival  CT head is negative.  Patient seen by Dr. Harle Battiest of neurology.  NIH stroke score 0.  She recommends toxic metabolic work-up with admission for MRI and EEG if negative. Neurology agrees not a candidate for tPA or intervention given low NIH and improving deficits  Infectious work-up was reassuring.  Chest x-ray is negative, COVID is negative, no CO2 retention.  Ammonia is normal  Urinalysis is pending.  Chest x-ray is negative, CT head is negative.  Otherwise labs are reassuring.  Neurology recommends admission for MRI and EEG for possibility of seizure.  May also be due to medication effect.  Admission discussed with Dr. Clearence Ped. Final Clinical Impression(s) / ED Diagnoses Final diagnoses:  Altered mental status, unspecified altered mental status type    Rx / DC Orders ED Discharge Orders     None        Roland Prine, Annie Main, MD 02/09/21 [id number]    Ezequiel Essex, MD 02/09/21 [phone]

## 2021-02-08 NOTE — ED Triage Notes (Signed)
Pt's daughter states pt not able to stand without assistance and talking out of head-woke up from a nap today and pt states she needed to go to work and thought she had something in her hand and pt didn't have anything. Last seen normal at 1800 today per daughter. Pt denies HA or pain anywhere.

## 2021-02-09 ENCOUNTER — Observation Stay (HOSPITAL_BASED_OUTPATIENT_CLINIC_OR_DEPARTMENT_OTHER): Payer: Medicare Other

## 2021-02-09 ENCOUNTER — Observation Stay (HOSPITAL_COMMUNITY): Payer: Medicare Other

## 2021-02-09 DIAGNOSIS — I10 Essential (primary) hypertension: Secondary | ICD-10-CM | POA: Diagnosis not present

## 2021-02-09 DIAGNOSIS — E1159 Type 2 diabetes mellitus with other circulatory complications: Secondary | ICD-10-CM

## 2021-02-09 DIAGNOSIS — I152 Hypertension secondary to endocrine disorders: Secondary | ICD-10-CM | POA: Diagnosis not present

## 2021-02-09 DIAGNOSIS — R109 Unspecified abdominal pain: Secondary | ICD-10-CM | POA: Diagnosis not present

## 2021-02-09 DIAGNOSIS — G9341 Metabolic encephalopathy: Secondary | ICD-10-CM | POA: Diagnosis not present

## 2021-02-09 LAB — URINALYSIS, ROUTINE W REFLEX MICROSCOPIC
Bilirubin Urine: NEGATIVE
Glucose, UA: NEGATIVE mg/dL
Hgb urine dipstick: NEGATIVE
Ketones, ur: NEGATIVE mg/dL
Leukocytes,Ua: NEGATIVE
Nitrite: NEGATIVE
Protein, ur: NEGATIVE mg/dL
Specific Gravity, Urine: 1.02 (ref 1.005–1.030)
pH: 5 (ref 5.0–8.0)

## 2021-02-09 LAB — BLOOD GAS, VENOUS
Acid-Base Excess: 1.1 mmol/L (ref 0.0–2.0)
Bicarbonate: 24.4 mmol/L (ref 20.0–28.0)
FIO2: 21
O2 Saturation: 61.1 %
Patient temperature: 36.5
pCO2, Ven: 42.5 mmHg — ABNORMAL LOW (ref 44.0–60.0)
pH, Ven: 7.394 (ref 7.250–7.430)
pO2, Ven: 34.1 mmHg (ref 32.0–45.0)

## 2021-02-09 LAB — COMPREHENSIVE METABOLIC PANEL
ALT: 17 U/L (ref 0–44)
ALT: 14 U/L (ref 0–44)
AST: 24 U/L (ref 15–41)
AST: 21 U/L (ref 15–41)
Albumin: 4.3 g/dL (ref 3.5–5.0)
Albumin: 3.6 g/dL (ref 3.5–5.0)
Alkaline Phosphatase: 68 U/L (ref 38–126)
Alkaline Phosphatase: 59 U/L (ref 38–126)
Anion gap: 4 — ABNORMAL LOW (ref 5–15)
Anion gap: 9 (ref 5–15)
BUN: 16 mg/dL (ref 8–23)
BUN: 14 mg/dL (ref 8–23)
CO2: 27 mmol/L (ref 22–32)
CO2: 23 mmol/L (ref 22–32)
Calcium: 9 mg/dL (ref 8.9–10.3)
Calcium: 8.8 mg/dL — ABNORMAL LOW (ref 8.9–10.3)
Chloride: 105 mmol/L (ref 98–111)
Chloride: 109 mmol/L (ref 98–111)
Creatinine, Ser: 0.99 mg/dL (ref 0.44–1.00)
Creatinine, Ser: 0.81 mg/dL (ref 0.44–1.00)
GFR, Estimated: >60 mL/min (ref >60)
GFR, Estimated: >60 mL/min (ref >60)
Glucose, Bld: 110 mg/dL — ABNORMAL HIGH (ref 70–99)
Glucose, Bld: 89 mg/dL (ref 70–99)
Potassium: 3.9 mmol/L (ref 3.5–5.1)
Potassium: 4 mmol/L (ref 3.5–5.1)
Sodium: 136 mmol/L (ref 135–145)
Sodium: 141 mmol/L (ref 135–145)
Total Bilirubin: 0.6 mg/dL (ref 0.3–1.2)
Total Bilirubin: 0.7 mg/dL (ref 0.3–1.2)
Total Protein: 7.6 g/dL (ref 6.5–8.1)
Total Protein: 6.3 g/dL — ABNORMAL LOW (ref 6.5–8.1)

## 2021-02-09 LAB — RESP PANEL BY RT-PCR (FLU A&B, COVID) ARPGX2
Influenza A by PCR: NEGATIVE
Influenza B by PCR: NEGATIVE
SARS Coronavirus 2 by RT PCR: NEGATIVE

## 2021-02-09 LAB — ECHOCARDIOGRAM COMPLETE
AR max vel: 2.05 cm2
AV Area VTI: 2.18 cm2
AV Area mean vel: 2.13 cm2
AV Mean grad: 4 mmHg
AV Peak grad: 6.9 mmHg
Ao pk vel: 1.31 m/s
Area-P 1/2: 3.42 cm2
Height: 67.008 in
MV VTI: 1.77 cm2
S' Lateral: 1.8 cm
Weight: 2585.55 oz

## 2021-02-09 LAB — CBC
HCT: 39.8 % (ref 36.0–46.0)
Hemoglobin: 12.3 g/dL (ref 12.0–15.0)
MCH: 28.3 pg (ref 26.0–34.0)
MCHC: 30.9 g/dL (ref 30.0–36.0)
MCV: 91.5 fL (ref 80.0–100.0)
Platelets: 201 10*3/uL (ref 150–400)
RBC: 4.35 MIL/uL (ref 3.87–5.11)
RDW: 15.9 % — ABNORMAL HIGH (ref 11.5–15.5)
WBC: 4.2 10*3/uL (ref 4.0–10.5)
nRBC: 0 % (ref 0.0–0.2)

## 2021-02-09 LAB — AMMONIA: Ammonia: 13 umol/L (ref 9–35)

## 2021-02-09 LAB — LIPID PANEL
Cholesterol: 212 mg/dL — ABNORMAL HIGH (ref 0–200)
HDL: 73 mg/dL (ref >40)
LDL Cholesterol: 128 mg/dL — ABNORMAL HIGH (ref 0–99)
Total CHOL/HDL Ratio: 2.9 RATIO
Triglycerides: 57 mg/dL (ref <150)
VLDL: 11 mg/dL (ref 0–40)

## 2021-02-09 LAB — RAPID URINE DRUG SCREEN, HOSP PERFORMED
Amphetamines: NOT DETECTED
Barbiturates: NOT DETECTED
Benzodiazepines: NOT DETECTED
Cocaine: NOT DETECTED
Opiates: NOT DETECTED
Tetrahydrocannabinol: NOT DETECTED

## 2021-02-09 LAB — TSH: TSH: 1.673 u[IU]/mL (ref 0.350–4.500)

## 2021-02-09 LAB — HEMOGLOBIN A1C
Hgb A1c MFr Bld: 5.5 % (ref 4.8–5.6)
Mean Plasma Glucose: 111.15 mg/dL

## 2021-02-09 LAB — ETHANOL: Alcohol, Ethyl (B): <10 mg/dL (ref <10)

## 2021-02-09 LAB — VITAMIN B12: Vitamin B-12: 153 pg/mL — ABNORMAL LOW (ref 180–914)

## 2021-02-09 MED ORDER — STROKE: EARLY STAGES OF RECOVERY BOOK: Freq: Once | Status: DC

## 2021-02-09 MED ORDER — ACETAMINOPHEN 325 MG PO TABS: 650.0000 mg | ORAL_TABLET | ORAL | Status: DC | PRN

## 2021-02-09 MED ORDER — HEPARIN SODIUM (PORCINE) 5000 UNIT/ML IJ SOLN
5000.0000 [IU] | Freq: Three times a day (TID) | INTRAMUSCULAR | Status: DC
  Administered 2021-02-09: 5000 [IU] via SUBCUTANEOUS
  Filled 2021-02-09: qty 1

## 2021-02-09 MED ORDER — ACETAMINOPHEN 650 MG RE SUPP: 650.0000 mg | RECTAL | Status: DC | PRN

## 2021-02-09 MED ORDER — AMLODIPINE BESYLATE 5 MG PO TABS
2.5000 mg | ORAL_TABLET | Freq: Every day | ORAL | Status: DC
  Administered 2021-02-09: 2.5 mg via ORAL
  Filled 2021-02-09: qty 1

## 2021-02-09 MED ORDER — DICYCLOMINE HCL 10 MG PO CAPS
10.0000 mg | ORAL_CAPSULE | Freq: Three times a day (TID) | ORAL | Status: DC
  Administered 2021-02-09: 10 mg via ORAL
  Filled 2021-02-09: qty 1

## 2021-02-09 MED ORDER — ACETAMINOPHEN 160 MG/5ML PO SOLN: 650.0000 mg | ORAL | Status: DC | PRN

## 2021-02-09 MED ORDER — SENNOSIDES-DOCUSATE SODIUM 8.6-50 MG PO TABS: 1.0000 | ORAL_TABLET | Freq: Every evening | ORAL | Status: DC | PRN

## 2021-02-09 NOTE — Evaluation (Addendum)
Physical Therapy Evaluation Patient Details Name: TEE NEVE MRN: [id number] DOB: December 17, 1950 Today's Date: 02/09/2021  History of Present Illness  Rodnesha Engholm  is a 70 y.o. 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  Unfortunately patient does not remember the episode.  She says that her daughter told her she woke up incoherent, confused, and agitated.  Patient reports that she remembers being in the car with her daughter on the way to the hospital but does not remember anything before that.  She has never had a confused episode like this before she reports.  She has not had any seizures.  She has never had a stroke.  Patient reports that she is recently started on a new medication called amitriptyline.  She started 2 weeks ago so he should be therapeutic around now.  She reports it was started for bad stomach pain.  Patient has been having work-up done for stomach pain for 1 year.  Most recently on August 24 Dr. Abbey Chatters diagnosed patient with visceral hypersensitivity and started amitriptyline for that.  She had already done a Bentyl trial.  Patient reports that the pain is in her right lower quadrant all the time and radiates across her belly to the left.  She reports that eating, and having a bowel movement, do not affect the pain.  Patient is status post hysterectomy.  She had a colonoscopy in 03/2020.  She was advised to return in 5 years.  Patient has no other complaints at this time and reports that she thought she was in her normal state of health.   Clinical Impression  Patient functioning at baseline for functional mobility and gait demonstrating good return for ambulation on level, inclined and declined surfaces without loss of balance.  Plan:  Patient discharged from physical therapy to care of nursing for ambulation daily as  tolerated for length of stay.         Recommendations for follow up therapy are one component of a multi-disciplinary discharge planning process, led by the attending physician.  Recommendations may be updated based on patient status, additional functional criteria and insurance authorization.  Follow Up Recommendations No PT follow up    Equipment Recommendations  None recommended by PT    Recommendations for Other Services       Precautions / Restrictions Precautions Precautions: None Restrictions Weight Bearing Restrictions: No      Mobility  Bed Mobility Overal bed mobility: Modified Independent                  Transfers Overall transfer level: Modified independent                  Ambulation/Gait Ambulation/Gait assistance: Modified independent (Device/Increase time) Gait Distance (Feet): 200 Feet Assistive device: None Gait Pattern/deviations: WFL(Within Functional Limits) Gait velocity: slightly decreased   General Gait Details: grossly WFL demonstraring good return for ambulation on level, inclined and declined surfaces without loss of balance  Stairs            Wheelchair Mobility    Modified Rankin (Stroke Patients Only)       Balance Overall balance assessment: No apparent balance deficits (not formally assessed)  Pertinent Vitals/Pain Pain Assessment: No/denies pain    Home Living Family/patient expects to be discharged to:: Private residence Living Arrangements: Children Available Help at Discharge: Family;Available PRN/intermittently Type of Home: House Home Access: Level entry     Home Layout: One level Home Equipment: None      Prior Function Level of Independence: Independent         Comments: Hydrographic surveyor, drives, works     Journalist, newspaper        Extremity/Trunk Assessment   Upper Extremity Assessment Upper Extremity Assessment:  Defer to OT evaluation    Lower Extremity Assessment Lower Extremity Assessment: Overall WFL for tasks assessed    Cervical / Trunk Assessment Cervical / Trunk Assessment: Normal  Communication   Communication: No difficulties  Cognition Arousal/Alertness: Awake/alert Behavior During Therapy: WFL for tasks assessed/performed Overall Cognitive Status: Within Functional Limits for tasks assessed                                        General Comments      Exercises     Assessment/Plan    PT Assessment Patent does not need any further PT services  PT Problem List         PT Treatment Interventions      PT Goals (Current goals can be found in the Care Plan section)  Acute Rehab PT Goals Patient Stated Goal: return home with family to assist PT Goal Formulation: With patient Time For Goal Achievement: 02/09/21 Potential to Achieve Goals: Good    Frequency     Barriers to discharge        Co-evaluation PT/OT/SLP Co-Evaluation/Treatment: Yes Reason for Co-Treatment: To address functional/ADL transfers PT goals addressed during session: Mobility/safety with mobility;Balance         AM-PAC PT "6 Clicks" Mobility  Outcome Measure Help needed turning from your back to your side while in a flat bed without using bedrails?: None Help needed moving from lying on your back to sitting on the side of a flat bed without using bedrails?: None Help needed moving to and from a bed to a chair (including a wheelchair)?: None Help needed standing up from a chair using your arms (e.g., wheelchair or bedside chair)?: None Help needed to walk in hospital room?: None Help needed climbing 3-5 steps with a railing? : None 6 Click Score: 24    End of Session   Activity Tolerance: Patient tolerated treatment well Patient left: in bed;with call bell/phone within reach Nurse Communication: Mobility status PT Visit Diagnosis: Unsteadiness on feet (R26.81);Other  abnormalities of gait and mobility (R26.89);Muscle weakness (generalized) (M62.81)    Time: [id number] PT Time Calculation (min) (ACUTE ONLY): 20 min   Charges:   PT Evaluation $PT Eval Moderate Complexity: 1 Mod PT Treatments $Therapeutic Activity: 23-37 mins        9:34 AM, 02/09/21 Lonell Grandchild, MPT Physical Therapist with [facility] 336 [phone] office [phone] mobile phone

## 2021-02-09 NOTE — Discharge Instructions (Signed)
IMPORTANT INFORMATION: PAY CLOSE ATTENTION   PHYSICIAN DISCHARGE INSTRUCTIONS  Follow with Primary care provider  Dorothyann Peng, FNP  and other consultants as instructed by your Hospitalist Physician  [place] IF SYMPTOMS COME BACK, WORSEN OR NEW PROBLEM DEVELOPS   Please note: You were cared for by a hospitalist during your hospital stay. Every effort will be made to forward records to your primary care provider.  You can request that your primary care provider send for your hospital records if they have not received them.  Once you are discharged, your primary care physician will handle any further medical issues. Please note that NO REFILLS for any discharge medications will be authorized once you are discharged, as it is imperative that you return to your primary care physician (or establish a relationship with a primary care physician if you do not have one) for your post hospital discharge needs so that they can reassess your need for medications and monitor your lab values.  Please get a complete blood count and chemistry panel checked by your Primary MD at your next visit, and again as instructed by your Primary MD.  Get Medicines reviewed and adjusted: Please take all your medications with you for your next visit with your Primary MD  Laboratory/radiological data: Please request your Primary MD to go over all hospital tests and procedure/radiological results at the follow up, please ask your primary care provider to get all Hospital records sent to his/her office.  In some cases, they will be blood work, cultures and biopsy results pending at the time of your discharge. Please request that your primary care provider follow up on these results.  If you are diabetic, please bring your blood sugar readings with you to your follow up appointment with primary care.    Please call and make your follow up appointments as soon as possible.    Also Note  the following: If you experience worsening of your admission symptoms, develop shortness of breath, life threatening emergency, suicidal or homicidal thoughts you must seek medical attention immediately by calling 911 or calling your MD immediately  if symptoms less severe.  You must read complete instructions/literature along with all the possible adverse reactions/side effects for all the Medicines you take and that have been prescribed to you. Take any new Medicines after you have completely understood and accpet all the possible adverse reactions/side effects.   Do not drive when taking Pain medications or sleeping medications (Benzodiazepines)  Do not take more than prescribed Pain, Sleep and Anxiety Medications. It is not advisable to combine anxiety,sleep and pain medications without talking with your primary care practitioner  Special Instructions: If you have smoked or chewed Tobacco  in the last 2 yrs please stop smoking, stop any regular Alcohol  and or any Recreational drug use.  Wear Seat belts while driving.  Do not drive if taking any narcotic, mind altering or controlled substances or recreational drugs or alcohol.

## 2021-02-09 NOTE — Discharge Summary (Signed)
Physician Discharge Summary  Krystal Morris [id number] DOB: 11/23/1950 DOA: 02/08/2021  PCP: Dorothyann Peng, FNP  Admit date: 02/08/2021 Discharge date: 02/09/2021  Admitted From:  Home  Disposition:  Home   Recommendations for Outpatient Follow-up:  Follow up with PCP in 1 weeks Follow up with GI as scheduled  Discharge Condition: STABLE   CODE STATUS: FULL DIET: resume prior home diet   Brief Hospitalization Summary: Please see all hospital notes, images, labs for full details of the hospitalization. ADMISSION HPI:  Krystal Morris  is a 70 y.o. 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  Unfortunately patient does not remember the episode.  She says that her daughter told her she woke up incoherent, confused, and agitated.  Patient reports that she remembers being in the car with her daughter on the way to the hospital but does not remember anything before that.  She has never had a confused episode like this before she reports.  She has not had any seizures.  She has never had a stroke.  Patient reports that she is recently started on a new medication called amitriptyline.  She started 2 weeks ago so he should be therapeutic around now.  She reports it was started for bad stomach pain.  Patient has been having work-up done for stomach pain for 1 year.  Most recently on August 24 Dr. Abbey Chatters diagnosed patient with visceral hypersensitivity and started amitriptyline for that.  She had already done a Bentyl trial.  Patient reports that the pain is in her right lower quadrant all the time and radiates across her belly to the left.  She reports that eating, and having a bowel movement, do not affect the pain.  Patient is status post hysterectomy.  She had a colonoscopy in 03/2020.  She was advised to return in 5 years.  Patient has no other  complaints at this time and reports that she thought she was in her normal state of health.   Patient does not smoke, does not drink alcohol, does not use illicit drugs, is vaccinated for COVID.   The ED Temp 97.7, heart rate 85-96, respiratory rate 16-23, blood pressure 131/75, satting 99% pH 7.394, PCO2 42.5 CT code stroke shows normal head CT. Chest x-ray shows no active disease, moderate hiatal hernia. Respiratory panel negative.  Last TSH 1.673. 5 blood cell count 4.3, hemoglobin 13.3, platelets 247 Chemistry panel shows nothing remarkable. Alcohol levels less than 10, ammonia levels 13. Neurology was consulted and recommended EEG, MRI, aspirin Felt so as to finish the medical work-up, UDS and UA are pending  Hospital Course  This patient was admitted with concern for acute metabolic encephalopathy but it had been resolved by the time the patient was admitted.  This was thought to be caused by a new medication that was started called amitriptyline.  This was discontinued.  As part of the work-up MRI was ordered.  The MRI resulted with no acute findings to explain symptoms.  Patient is totally back to normal.  Patient would like to go home.  I recommended that she follow-up with her PCP and her GI doctor to discuss treatment alternatives to the amitriptyline.  In addition, EEG was ordered but was not able to be obtained prior to discharge and patient did not want to wait longer for the test to be done.  I asked her  to follow-up with her PCP to discuss ordering the tests on an outpatient basis if they felt it was necessary.  I do not think it is necessary at this time less she has recurrent symptoms.  Patient verbalized understanding.  PT evaluated patient and no further PT follow-up was recommended.  Patient is stable to discharge home today.  Discharge Diagnoses:  Active Problems:   Abdominal pain   Acute metabolic encephalopathy   Hypertension complicating diabetes [facility])   Discharge  Instructions:  Allergies as of 02/09/2021       Reactions   Penicillins Rash   Has patient had a PCN reaction causing immediate rash, facial/tongue/throat swelling, SOB or lightheadedness with hypotension: Yes Has patient had a PCN reaction causing severe rash involving mucus membranes or skin necrosis: No Has patient had a PCN reaction that required hospitalization: No Has patient had a PCN reaction occurring within the last 10 years: No If all of the above answers are "NO", then may proceed with Cephalosporin use.        Medication List     STOP taking these medications    amitriptyline 10 MG tablet Commonly known as: ELAVIL       TAKE these medications    acetaminophen 500 MG tablet Commonly known as: TYLENOL Take 1,000 mg by mouth every 6 (six) hours as needed for headache.   amLODipine 2.5 MG tablet Commonly known as: NORVASC Take 2.5 mg by mouth daily.   dicyclomine 10 MG capsule Commonly known as: BENTYL Take 10 mg by mouth 3 (three) times daily as needed (abdominal cramping).   multivitamin with minerals tablet Take 1 tablet by mouth 2 (two) times a week.        Follow-up Information     Dorothyann Peng, FNP. Schedule an appointment as soon as possible for a visit in 1 week(s).   Specialty: Family Medicine Why: Hospital Follow Up Contact information: [place] [address] [phone]         Eloise Harman, DO. Schedule an appointment as soon as possible for a visit in 2 week(s).   Specialty: Gastroenterology Why: Hospital Follow Up Contact information: [place] [address] [phone]                Allergies  Allergen Reactions   Penicillins Rash    Has patient had a PCN reaction causing immediate rash, facial/tongue/throat swelling, SOB or lightheadedness with hypotension: Yes Has patient had a PCN reaction causing severe rash involving mucus membranes or skin necrosis: No Has patient had a  PCN reaction that required hospitalization: No Has patient had a PCN reaction occurring within the last 10 years: No If all of the above answers are "NO", then may proceed with Cephalosporin use.    Allergies as of 02/09/2021       Reactions   Penicillins Rash   Has patient had a PCN reaction causing immediate rash, facial/tongue/throat swelling, SOB or lightheadedness with hypotension: Yes Has patient had a PCN reaction causing severe rash involving mucus membranes or skin necrosis: No Has patient had a PCN reaction that required hospitalization: No Has patient had a PCN reaction occurring within the last 10 years: No If all of the above answers are "NO", then may proceed with Cephalosporin use.        Medication List     STOP taking these medications    amitriptyline 10 MG tablet Commonly known as: ELAVIL  TAKE these medications    acetaminophen 500 MG tablet Commonly known as: TYLENOL Take 1,000 mg by mouth every 6 (six) hours as needed for headache.   amLODipine 2.5 MG tablet Commonly known as: NORVASC Take 2.5 mg by mouth daily.   dicyclomine 10 MG capsule Commonly known as: BENTYL Take 10 mg by mouth 3 (three) times daily as needed (abdominal cramping).   multivitamin with minerals tablet Take 1 tablet by mouth 2 (two) times a week.        Procedures/Studies: MR BRAIN WO CONTRAST  Result Date: 02/09/2021 CLINICAL DATA:  Mental status change, unknown cause EXAM: MRI HEAD WITHOUT CONTRAST TECHNIQUE: Multiplanar, multiecho pulse sequences of the brain and surrounding structures were obtained without intravenous contrast. COMPARISON:  None. FINDINGS: Brain: There is no acute infarction or intracranial hemorrhage. There is no intracranial mass, mass effect, or edema. There is no hydrocephalus or extra-axial fluid collection. Ventricles and sulci are normal in size and configuration. Minimal patchy foci of T2 hyperintensity in the supratentorial [place] matter  are nonspecific and could reflect minor chronic microvascular ischemic changes. Vascular: Major vessel flow voids at the skull base are preserved. Skull and upper cervical spine: Normal marrow signal is preserved. Sinuses/Orbits: Paranasal sinuses are aerated. Orbits are unremarkable. Other: Sella is unremarkable.  Mastoid air cells are clear. IMPRESSION: No evidence of recent infarction, hemorrhage, or mass. Electronically Signed   By: Macy Mis M.D.   On: 02/09/2021 10:49   DG Chest Portable 1 View  Result Date: 02/09/2021 CLINICAL DATA:  Altered mental status EXAM: PORTABLE CHEST 1 VIEW COMPARISON:  03/12/2014 FINDINGS: Retrocardiac lucency represents a a moderate hiatal hernia, better seen on CT examination of 02/29/2020. Lungs are otherwise clear. No pneumothorax or pleural effusion. Cardiac size within normal limits. Pulmonary vascularity is normal. No acute bone abnormality. IMPRESSION: No active disease. Moderate hiatal hernia. Electronically Signed   By: Fidela Salisbury M.D.   On: 02/09/2021 00:09   ECHOCARDIOGRAM COMPLETE  Result Date: 02/09/2021    ECHOCARDIOGRAM REPORT   Patient Name:   Krystal Morris Date of Exam: 02/09/2021 Medical Rec #:  [id number]       Height:       67.0 in Accession #:    [id number]      Weight:       161.6 lb Date of Birth:  07-Sep-1950       BSA:          1.847 m Patient Age:    70 years        BP:           127/80 mmHg Patient Gender: F               HR:           79 bpm. Exam Location:  [facility] Procedure: 2D Echo, Cardiac Doppler and Color Doppler Indications:    Stroke  History:        Patient has no prior history of Echocardiogram examinations.                 Risk Factors:Hypertension and Diabetes.  Sonographer:    Wenda Low Referring Phys: [id number] ASIA B [place]  1. Left ventricular ejection fraction, by estimation, is 60 to 65%. The left ventricle has normal function. The left ventricle has no regional wall motion abnormalities. Left  ventricular diastolic parameters are consistent with Grade I diastolic dysfunction (impaired relaxation).  2. Right ventricular systolic function is normal. The right  ventricular size is normal. There is normal pulmonary artery systolic pressure.  3. The mitral valve is normal in structure. No evidence of mitral valve regurgitation. No evidence of mitral stenosis.  4. The aortic valve is tricuspid. Aortic valve regurgitation is not visualized. No aortic stenosis is present. FINDINGS  Left Ventricle: Left ventricular ejection fraction, by estimation, is 60 to 65%. The left ventricle has normal function. The left ventricle has no regional wall motion abnormalities. The left ventricular internal cavity size was normal in size. There is  no left ventricular hypertrophy. Left ventricular diastolic parameters are consistent with Grade I diastolic dysfunction (impaired relaxation). Normal left ventricular filling pressure. Right Ventricle: The right ventricular size is normal. No increase in right ventricular wall thickness. Right ventricular systolic function is normal. There is normal pulmonary artery systolic pressure. The tricuspid regurgitant velocity is 2.24 m/s, and  with an assumed right atrial pressure of 3 mmHg, the estimated right ventricular systolic pressure is [id number] mmHg. Left Atrium: Left atrial size was normal in size. Right Atrium: Right atrial size was normal in size. Pericardium: There is no evidence of pericardial effusion. Mitral Valve: The mitral valve is normal in structure. No evidence of mitral valve regurgitation. No evidence of mitral valve stenosis. MV peak gradient, 5.1 mmHg. The mean mitral valve gradient is 2.0 mmHg. Tricuspid Valve: The tricuspid valve is normal in structure. Tricuspid valve regurgitation is not demonstrated. No evidence of tricuspid stenosis. Aortic Valve: The aortic valve is tricuspid. Aortic valve regurgitation is not visualized. No aortic stenosis is present. Aortic valve  mean gradient measures 4.0 mmHg. Aortic valve peak gradient measures 6.9 mmHg. Aortic valve area, by VTI measures 2.18 cm. Pulmonic Valve: The pulmonic valve was not well visualized. Pulmonic valve regurgitation is not visualized. No evidence of pulmonic stenosis. Aorta: The aortic root is normal in size and structure. IAS/Shunts: No atrial level shunt detected by color flow Doppler.  LEFT VENTRICLE PLAX 2D LVIDd:         2.80 cm  Diastology LVIDs:         1.80 cm  LV e' medial:    7.72 cm/s LV PW:         1.10 cm  LV E/e' medial:  9.8 LV IVS:        1.00 cm  LV e' lateral:   10.10 cm/s LVOT diam:     2.00 cm  LV E/e' lateral: 7.5 LV SV:         55 LV SV Index:   30 LVOT Area:     3.14 cm  RIGHT VENTRICLE RV Basal diam:  2.65 cm RV Mid diam:    2.40 cm RV S prime:     11.70 cm/s TAPSE (M-mode): 2.2 cm LEFT ATRIUM           Index       RIGHT ATRIUM           Index LA diam:      3.40 cm 1.84 cm/m  RA Area:     13.20 cm LA Vol (A4C): 46.7 ml 25.28 ml/m RA Volume:   32.80 ml  17.76 ml/m  AORTIC VALVE                   PULMONIC VALVE AV Area (Vmax):    2.05 cm    PV Vmax:       0.68 m/s AV Area (Vmean):   2.13 cm    PV Peak grad:  1.8 mmHg  AV Area (VTI):     2.18 cm AV Vmax:           131.00 cm/s AV Vmean:          90.800 cm/s AV VTI:            0.252 m AV Peak Grad:      6.9 mmHg AV Mean Grad:      4.0 mmHg LVOT Vmax:         85.50 cm/s LVOT Vmean:        61.700 cm/s LVOT VTI:          0.175 m LVOT/AV VTI ratio: 0.69  AORTA Ao Root diam: 3.00 cm MITRAL VALVE                TRICUSPID VALVE MV Area (PHT): 3.42 cm     TR Peak grad:   20.1 mmHg MV Area VTI:   1.77 cm     TR Vmax:        224.00 cm/s MV Peak grad:  5.1 mmHg MV Mean grad:  2.0 mmHg     SHUNTS MV Vmax:       1.13 m/s     Systemic VTI:  0.18 m MV Vmean:      59.0 cm/s    Systemic Diam: 2.00 cm MV Decel Time: 222 msec MV E velocity: 75.80 cm/s MV A velocity: 108.00 cm/s MV E/A ratio:  0.70 Carlyle Dolly MD Electronically signed by Carlyle Dolly MD  Signature Date/Time: 02/09/2021/10:43:35 AM    Final    CT HEAD CODE STROKE WO CONTRAST  Result Date: 02/09/2021 CLINICAL DATA:  Code stroke. EXAM: CT HEAD WITHOUT CONTRAST TECHNIQUE: Contiguous axial images were obtained from the base of the skull through the vertex without intravenous contrast. COMPARISON:  None. FINDINGS: Brain: There is no mass, hemorrhage or extra-axial collection. The size and configuration of the ventricles and extra-axial CSF spaces are normal. The brain parenchyma is normal, without evidence of acute or chronic infarction. Vascular: No abnormal hyperdensity of the major intracranial arteries or dural venous sinuses. No intracranial atherosclerosis. Skull: The visualized skull base, calvarium and extracranial soft tissues are normal. Sinuses/Orbits: No fluid levels or advanced mucosal thickening of the visualized paranasal sinuses. No mastoid or middle ear effusion. The orbits are normal. ASPECTS [facility] Stroke Program Early CT Score) - Ganglionic level infarction (caudate, lentiform nuclei, internal capsule, insula, M1-M3 cortex): 7 - Supraganglionic infarction (M4-M6 cortex): 3 Total score (0-10 with 10 being normal): 10 IMPRESSION: 1. Normal head CT. 2. ASPECTS is 10. These results were called by telephone at the time of interpretation on 02/09/2021 at 12:07 am to provider Carollee Leitz, who verbally acknowledged these results. Electronically Signed   By: Ulyses Jarred M.D.   On: 02/09/2021 00:08     Subjective: Pt reports that she is feeling well. She is having no further symptoms and wants to go home.  Pt doesn't want to wait any longer for an EEG.     Discharge Exam: Vitals:   02/09/21 1055 02/09/21 1136  BP: 117/67 (!) 141/81  Pulse: 81 86  Resp: 20 14  Temp: 98.1 F (36.7 C) 98.2 F (36.8 C)  SpO2: 99% 100%   Vitals:   02/09/21 0710 02/09/21 1000 02/09/21 1055 02/09/21 1136  BP: 127/80 132/78 117/67 (!) 141/81  Pulse: 79 74 81 86  Resp: '16 12 20 14  '$ Temp:    98.1 F (36.7 C) 98.2 F (36.8 C)  TempSrc:  Oral Oral  SpO2: 97%  99% 100%  Weight:      Height:        General: Pt is alert, awake, not in acute distress Cardiovascular: RRR, S1/S2 +, no rubs, no gallops Respiratory: CTA bilaterally, no wheezing, no rhonchi Abdominal: Soft, NT, ND, bowel sounds + Extremities: no edema, no cyanosis   The results of significant diagnostics from this hospitalization (including imaging, microbiology, ancillary and laboratory) are listed below for reference.     Microbiology: Recent Results (from the past 240 hour(s))  Resp Panel by RT-PCR (Flu A&B, Covid) Nasopharyngeal Swab     Status: None   Collection Time: 02/08/21 11:39 PM   Specimen: Nasopharyngeal Swab; Nasopharyngeal(NP) swabs in vial transport medium  Result Value Ref Range Status   SARS Coronavirus 2 by RT PCR NEGATIVE NEGATIVE Final    Comment: (NOTE) SARS-CoV-2 target nucleic acids are NOT DETECTED.  The SARS-CoV-2 RNA is generally detectable in upper respiratory specimens during the acute phase of infection. The lowest concentration of SARS-CoV-2 viral copies this assay can detect is 138 copies/mL. A negative result does not preclude SARS-Cov-2 infection and should not be used as the sole basis for treatment or other patient management decisions. A negative result may occur with  improper specimen collection/handling, submission of specimen other than nasopharyngeal swab, presence of viral mutation(s) within the areas targeted by this assay, and inadequate number of viral copies(<138 copies/mL). A negative result must be combined with clinical observations, patient history, and epidemiological information. The expected result is Negative.  Fact Sheet for Patients:  EntrepreneurPulse.com.au  Fact Sheet for Healthcare Providers:  IncredibleEmployment.be  This test is no t yet approved or cleared by the [place] FDA and  has been  authorized for detection and/or diagnosis of SARS-CoV-2 by FDA under an Emergency Use Authorization (EUA). This EUA will remain  in effect (meaning this test can be used) for the duration of the COVID-19 declaration under Section 564(b)(1) of the Act, 21 U.S.C.section 360bbb-3(b)(1), unless the authorization is terminated  or revoked sooner.       Influenza A by PCR NEGATIVE NEGATIVE Final   Influenza B by PCR NEGATIVE NEGATIVE Final    Comment: (NOTE) The Xpert Xpress SARS-CoV-2/FLU/RSV plus assay is intended as an aid in the diagnosis of influenza from Nasopharyngeal swab specimens and should not be used as a sole basis for treatment. Nasal washings and aspirates are unacceptable for Xpert Xpress SARS-CoV-2/FLU/RSV testing.  Fact Sheet for Patients: EntrepreneurPulse.com.au  Fact Sheet for Healthcare Providers: IncredibleEmployment.be  This test is not yet approved or cleared by the [place] FDA and has been authorized for detection and/or diagnosis of SARS-CoV-2 by FDA under an Emergency Use Authorization (EUA). This EUA will remain in effect (meaning this test can be used) for the duration of the COVID-19 declaration under Section 564(b)(1) of the Act, 21 U.S.C. section 360bbb-3(b)(1), unless the authorization is terminated or revoked.  Performed at [facility], [address], [place], [place] [address]      Labs: BNP (last 3 results) No results for input(s): BNP in the last 8760 hours. Basic Metabolic Panel: Recent Labs  Lab 02/08/21 2329 02/09/21 0608  NA 136 141  K 3.9 4.0  CL 105 109  CO2 27 23  GLUCOSE 110* 89  BUN 16 14  CREATININE 0.99 0.81  CALCIUM 9.0 8.8*   Liver Function Tests: Recent Labs  Lab 02/08/21 2329 02/09/21 0608  AST 24 21  ALT 17 14  ALKPHOS  68 59  BILITOT 0.6 0.7  PROT 7.6 6.3*  ALBUMIN 4.3 3.6   No results for input(s): LIPASE, AMYLASE in the last 168 hours. Recent Labs  Lab  02/09/21 0029  AMMONIA 13   CBC: Recent Labs  Lab 02/08/21 2329 02/09/21 0608  WBC 4.3 4.2  NEUTROABS 2.4  --   HGB 13.3 12.3  HCT 42.6 39.8  MCV 88.9 91.5  PLT 247 201   Cardiac Enzymes: No results for input(s): CKTOTAL, CKMB, CKMBINDEX, TROPONINI in the last 168 hours. BNP: Invalid input(s): POCBNP CBG: Recent Labs  Lab 02/08/21 2353  GLUCAP 110*   D-Dimer No results for input(s): DDIMER in the last 72 hours. Hgb A1c Recent Labs    02/09/21 0606  HGBA1C 5.5   Lipid Profile Recent Labs    02/09/21 0606  CHOL 212*  HDL 73  LDLCALC 128*  TRIG 57  CHOLHDL 2.9   Thyroid function studies Recent Labs    02/08/21 2329  TSH 1.673   Anemia work up Recent Labs    02/09/21 0555  VITAMINB12 153*   Urinalysis    Component Value Date/Time   COLORURINE YELLOW 02/09/2021 1010   APPEARANCEUR CLEAR 02/09/2021 1010   LABSPEC 1.020 02/09/2021 1010   PHURINE 5.0 02/09/2021 1010   GLUCOSEU NEGATIVE 02/09/2021 1010   HGBUR NEGATIVE 02/09/2021 1010   BILIRUBINUR NEGATIVE 02/09/2021 1010   KETONESUR NEGATIVE 02/09/2021 1010   PROTEINUR NEGATIVE 02/09/2021 1010   NITRITE NEGATIVE 02/09/2021 1010   LEUKOCYTESUR NEGATIVE 02/09/2021 1010   Sepsis Labs Invalid input(s): PROCALCITONIN,  WBC,  LACTICIDVEN Microbiology Recent Results (from the past 240 hour(s))  Resp Panel by RT-PCR (Flu A&B, Covid) Nasopharyngeal Swab     Status: None   Collection Time: 02/08/21 11:39 PM   Specimen: Nasopharyngeal Swab; Nasopharyngeal(NP) swabs in vial transport medium  Result Value Ref Range Status   SARS Coronavirus 2 by RT PCR NEGATIVE NEGATIVE Final    Comment: (NOTE) SARS-CoV-2 target nucleic acids are NOT DETECTED.  The SARS-CoV-2 RNA is generally detectable in upper respiratory specimens during the acute phase of infection. The lowest concentration of SARS-CoV-2 viral copies this assay can detect is 138 copies/mL. A negative result does not preclude SARS-Cov-2 infection  and should not be used as the sole basis for treatment or other patient management decisions. A negative result may occur with  improper specimen collection/handling, submission of specimen other than nasopharyngeal swab, presence of viral mutation(s) within the areas targeted by this assay, and inadequate number of viral copies(<138 copies/mL). A negative result must be combined with clinical observations, patient history, and epidemiological information. The expected result is Negative.  Fact Sheet for Patients:  EntrepreneurPulse.com.au  Fact Sheet for Healthcare Providers:  IncredibleEmployment.be  This test is no t yet approved or cleared by the [place] FDA and  has been authorized for detection and/or diagnosis of SARS-CoV-2 by FDA under an Emergency Use Authorization (EUA). This EUA will remain  in effect (meaning this test can be used) for the duration of the COVID-19 declaration under Section 564(b)(1) of the Act, 21 U.S.C.section 360bbb-3(b)(1), unless the authorization is terminated  or revoked sooner.       Influenza A by PCR NEGATIVE NEGATIVE Final   Influenza B by PCR NEGATIVE NEGATIVE Final    Comment: (NOTE) The Xpert Xpress SARS-CoV-2/FLU/RSV plus assay is intended as an aid in the diagnosis of influenza from Nasopharyngeal swab specimens and should not be used as a sole basis for treatment. Nasal washings  and aspirates are unacceptable for Xpert Xpress SARS-CoV-2/FLU/RSV testing.  Fact Sheet for Patients: EntrepreneurPulse.com.au  Fact Sheet for Healthcare Providers: IncredibleEmployment.be  This test is not yet approved or cleared by the [place] FDA and has been authorized for detection and/or diagnosis of SARS-CoV-2 by FDA under an Emergency Use Authorization (EUA). This EUA will remain in effect (meaning this test can be used) for the duration of the COVID-19 declaration  under Section 564(b)(1) of the Act, 21 U.S.C. section 360bbb-3(b)(1), unless the authorization is terminated or revoked.  Performed at [facility], [address], [place], [place] [address]    Time coordinating discharge:   SIGNED:  Irwin Brakeman, MD  [facility] 02/09/2021, 12:59 PM How to contact the [facility] Attending or Consulting provider [place] or covering provider during after hours Section, for this patient?  Check the care team in [facility] and look for a) attending/consulting [facility] provider listed and b) the [facility] team listed Log into [email or web address] and use [place]'s universal password to access. If you do not have the password, please contact the hospital operator. Locate the [facility] provider you are looking for under [facility] and page to a number that you can be directly reached. If you still have difficulty reaching the provider, please page the [facility] (Director on Call) for the Hospitalists listed on amion for assistance.

## 2021-02-09 NOTE — Plan of Care (Signed)

## 2021-02-09 NOTE — Tx Team (Signed)
Patient passed swallow screen.

## 2021-02-09 NOTE — Progress Notes (Signed)
OT Screen Note  Patient Details Name: Krystal Morris MRN: [id number] DOB: 1951/04/15   Cancelled Treatment:    Reason Eval/Treat Not Completed: OT screened, no needs identified, will sign off. Patient is presenting at her baseline of independent. She lives with daughter. She and her daughter both work. She does not use any AD to ambulate. She has a tub/shower and elevated toilet. 1 level house with level entry. No follow OT needs identified. Thank you for the referral.   Ailene Ravel, OTR/L,CBIS  [phone]  02/09/2021, 9:03 AM

## 2021-02-09 NOTE — H&P (Signed)
TRH H&P    Patient Demographics:    Krystal Morris, is a 69 y.o. female  MRN: [id number]  DOB - [birth year]  Admit Date - 02/08/2021  Referring MD/NP/PA: Rancour  Outpatient Primary MD for the patient is Dorothyann Peng, FNP  Patient coming from: Home  Chief complaint- altered mental status   HPI:    Krystal Morris  is a 69 y.o. 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  Unfortunately patient does not remember the episode.  She says that her daughter told her she woke up incoherent, confused, and agitated.  Patient reports that she remembers being in the car with her daughter on the way to the hospital but does not remember anything before that.  She has never had a confused episode like this before she reports.  She has not had any seizures.  She has never had a stroke.  Patient reports that she is recently started on a new medication called amitriptyline.  She started 2 weeks ago so he should be therapeutic around now.  She reports it was started for bad stomach pain.  Patient has been having work-up done for stomach pain for 1 year.  Most recently on August 24 Dr. Abbey Chatters diagnosed patient with visceral hypersensitivity and started amitriptyline for that.  She had already done a Bentyl trial.  Patient reports that the pain is in her right lower quadrant all the time and radiates across her belly to the left.  She reports that eating, and having a bowel movement, do not affect the pain.  Patient is status post hysterectomy.  She had a colonoscopy in 03/2020.  She was advised to return in 5 years.  Patient has no other complaints at this time and reports that she thought she was in her normal state of health.  Patient does not smoke, does not drink alcohol, does  not use illicit drugs, is vaccinated for COVID.  The ED Temp 97.7, heart rate 85-96, respiratory rate 16-23, blood pressure 131/75, satting 99% pH 7.394, PCO2 42.5 CT code stroke shows normal head CT. Chest x-ray shows no active disease, moderate hiatal hernia. Respiratory panel negative.  Last TSH 1.673. 5 blood cell count 4.3, hemoglobin 13.3, platelets 247 Chemistry panel shows nothing remarkable. Alcohol levels less than 10, ammonia levels 13. Neurology was consulted and recommended EEG, MRI, aspirin Felt so as to finish the medical work-up, UDS and UA are pending   Review of systems:    In addition to the HPI above,  No Fever-chills, No Headache, No changes with Vision or hearing, No problems swallowing food or Liquids, No Chest pain, Cough or Shortness of Breath, No Abdominal pain, No Nausea or Vomiting, bowel movements are regular, No Blood in stool or Urine, No dysuria, No new skin rashes or bruises, No new joints pains-aches,  No new weakness, tingling, numbness in any extremity, No recent weight gain or loss, No polyuria, polydypsia or  polyphagia, No significant Mental Stressors.  All other systems reviewed and are negative.    Past History of the following :    Past Medical History:  Diagnosis Date   Anxiety    Diverticulosis    Mild AC simple adenoma small interal hemorrhoids moderate-sized HH mild gastritis and duodenitis   GERD (gastroesophageal reflux disease)    Epigastric pain   Irritable bowel syndrome    Ulcer       Past Surgical History:  Procedure Laterality Date   ABDOMINAL HYSTERECTOMY     BIOPSY  03/24/2020   Procedure: BIOPSY;  Surgeon: Eloise Harman, DO;  Location: [facility];  Service: Endoscopy;;   CARDIAC SURGERY     CATARACT EXTRACTION W/PHACO Right 05/01/2018   Procedure: CATARACT EXTRACTION PHACO AND INTRAOCULAR LENS PLACEMENT ([place]);  Surgeon: Tonny Branch, MD;  Location: [facility];  Service: Ophthalmology;  Laterality: Right;   CDE: 11.14   CATARACT EXTRACTION W/PHACO Left 05/15/2018   Procedure: CATARACT EXTRACTION PHACO AND INTRAOCULAR LENS PLACEMENT ([facility]);  Surgeon: Tonny Branch, MD;  Location: [facility];  Service: Ophthalmology;  Laterality: Left;  CDE: 12.79   COLONOSCOPY  02/12/2008   Fields-Simple adenoma. Small int hemorrhoids   COLONOSCOPY  01/28/2012   Formed stool in MID-TRANSVERSE colon Mild diverticulosis was noted in the sigmoid colon.  , and Moderate sized hemorrhoids were found   COLONOSCOPY  02/22/2012   Three sessile polyps ranging between 3-44m in size were found in the descending colon (tubular adenomas); polypectomy was performed/Mild diverticulosis was noted in the sigmoid colon Moderate sized internal hemorrhoids-CAUSING RECTAL BLEEDING-S/P HEMORRHOID BANDINGx2. recommended 10 year f/u tcs   COLONOSCOPY WITH PROPOFOL N/A 03/24/2020   Non-bleeding internal hemorrhoids. Multiple diverticula. Three sessile polyps 5-8 mm in size in ascendign colon. 1 mm polyp in ascending colon that was sessile. Tubular adenomas. Negative colonic biopsies. 5 year surveillance.    ESOPHAGOGASTRODUODENOSCOPY  02/12/2008   Fields- Nl duodenum/chronic active gastritis   FLEXIBLE SIGMOIDOSCOPY N/A 08/13/2019   moderate external hemorrhoids, small internal hemorrhoids, moderate diverticulosis in rectosigmoid and sigmoid colon.    PARTIAL HYSTERECTOMY     POLYPECTOMY  03/24/2020   Procedure: POLYPECTOMY;  Surgeon: CEloise Harman DO;  [facility];  Service: Endoscopy;;   Thyroid Growth     Removal   TUBAL LIGATION     UMBILICAL HERNIA REPAIR        Social History:      Social History   Tobacco Use   Smoking status: Never   Smokeless tobacco: Never   Tobacco comments:    Never smoked  Substance Use Topics   Alcohol use: No    Alcohol/week: 0.0 standard drinks       Family History :     Family History  Problem Relation Age of Onset   Cancer Son    COPD Mother    COPD Father    Drug abuse  Sister        medication overdose   Coronary artery disease Son    Colon polyps Daughter       Home Medications:   Prior to Admission medications   Medication Sig Start Date End Date Taking? Authorizing Provider  acetaminophen (TYLENOL) 500 MG tablet Take 1,000 mg by mouth every 6 (six) hours as needed for headache.    [provider]  amitriptyline (ELAVIL) 10 MG tablet Take 1 tablet (10 mg total) by mouth at bedtime. 01/14/21 07/13/21  CEloise Harman DO  amLODipine (NORVASC)  2.5 MG tablet Take 2.5 mg by mouth daily. 03/07/20   [provider]  Multiple Vitamins-Minerals (MULTIVITAMIN WITH MINERALS) tablet Take 1 tablet by mouth 2 (two) times a week.    [provider]     Allergies:     Allergies  Allergen Reactions   Penicillins Rash    Has patient had a PCN reaction causing immediate rash, facial/tongue/throat swelling, SOB or lightheadedness with hypotension: Yes Has patient had a PCN reaction causing severe rash involving mucus membranes or skin necrosis: No Has patient had a PCN reaction that required hospitalization: No Has patient had a PCN reaction occurring within the last 10 years: No If all of the above answers are "NO", then may proceed with Cephalosporin use.      Physical Exam:   Vitals  Blood pressure 134/80, pulse 83, temperature 97.7 F (36.5 C), temperature source Oral, resp. rate 20, height 5' 7.01" (1.702 m), weight 73.3 kg, SpO2 100 %.  1.  General: Patient lying supine in bed,  no acute distress   2. Psychiatric: Alert and oriented x 3, mood and behavior normal for situation, pleasant and cooperative with exam   3. Neurologic: Speech and language are normal, face is symmetric, moves all 4 extremities voluntarily, at baseline without acute deficits on limited exam   4. HEENMT:  Head is atraumatic, normocephalic, pupils reactive to light, neck is supple, trachea is midline, mucous membranes are moist   5. Respiratory  : Lungs are clear to auscultation bilaterally without wheezing, rhonchi, rales, no cyanosis, no increase in work of breathing or accessory muscle use   6. Cardiovascular : Heart rate normal, rhythm is regular, no murmurs, rubs or gallops, no peripheral edema, peripheral pulses palpated   7. Gastrointestinal:  Abdomen is soft, nondistended, nontender to palpation bowel sounds active, no masses or organomegaly palpated   8. Skin:  Skin is warm, dry and intact without rashes, acute lesions, or ulcers on limited exam   9.Musculoskeletal:  No acute deformities or trauma, no asymmetry in tone, no peripheral edema, peripheral pulses palpated, no tenderness to palpation in the extremities     Data Review:    CBC Recent Labs  Lab 02/08/21 2329  WBC 4.3  HGB 13.3  HCT 42.6  PLT 247  MCV 88.9  MCH 27.8  MCHC 31.2  RDW 15.8*  LYMPHSABS 1.4  MONOABS 0.3  EOSABS 0.1  BASOSABS 0.1   ------------------------------------------------------------------------------------------------------------------  Results for orders placed or performed during the hospital encounter of 02/08/21 (from the past 48 hour(s))  Ethanol     Status: None   Collection Time: 02/08/21 11:29 PM  Result Value Ref Range   Alcohol, Ethyl (B) <10 <10 mg/dL    Comment: (NOTE) Lowest detectable limit for serum alcohol is 10 mg/dL.  For medical purposes only. Performed at [facility], [address] [place], [place] [address]   Protime-INR     Status: None   Collection Time: 02/08/21 11:29 PM  Result Value Ref Range   Prothrombin Time 12.4 11.4 - 15.2 seconds   INR 0.9 0.8 - 1.2    Comment: (NOTE) INR goal varies based on device and disease states. Performed at [facility], [address], [place], [place] [address]   APTT     Status: None   Collection Time: 02/08/21 11:29 PM  Result Value Ref Range   aPTT 28 24 - 36 seconds    Comment: Performed at [facility], [address], [place], [place] [address]  CBC     Status: Abnormal   Collection Time: 02/08/21 11:29 PM  Result Value Ref Range   WBC 4.3 4.0 - 10.5 K/uL   RBC 4.79 3.87 - 5.11 MIL/uL   Hemoglobin 13.3 12.0 - 15.0 g/dL   HCT 42.6 36.0 - 46.0 %   MCV 88.9 80.0 - 100.0 fL   MCH 27.8 26.0 - 34.0 pg   MCHC 31.2 30.0 - 36.0 g/dL   RDW 15.8 (H) 11.5 - 15.5 %   Platelets 247 150 - 400 K/uL   nRBC 0.0 0.0 - 0.2 %    Comment: Performed at [facility], [address] [place], [place] [address]  Differential     Status: None   Collection Time: 02/08/21 11:29 PM  Result Value Ref Range   Neutrophils Relative % 55 %   Neutro Abs 2.4 1.7 - 7.7 K/uL   Lymphocytes Relative 33 %   Lymphs Abs 1.4 0.7 - 4.0 K/uL   Monocytes Relative 8 %   Monocytes Absolute 0.3 0.1 - 1.0 K/uL   Eosinophils Relative 3 %   Eosinophils Absolute 0.1 0.0 - 0.5 K/uL   Basophils Relative 1 %   Basophils Absolute 0.1 0.0 - 0.1 K/uL   Immature Granulocytes 0 %   Abs Immature Granulocytes 0.01 0.00 - 0.07 K/uL    Comment: Performed at [facility], [address], [place], [place] [address]  Comprehensive metabolic panel     Status: Abnormal   Collection Time: 02/08/21 11:29 PM  Result Value Ref Range   Sodium 136 135 - 145 mmol/L   Potassium 3.9 3.5 - 5.1 mmol/L   Chloride 105 98 - 111 mmol/L   CO2 27 22 - 32 mmol/L   Glucose, Bld 110 (H) 70 - 99 mg/dL    Comment: Glucose reference range applies only to samples taken after fasting for at least 8 hours.   BUN 16 8 - 23 mg/dL   Creatinine, Ser 0.99 0.44 - 1.00 mg/dL   Calcium 9.0 8.9 - 10.3 mg/dL   Total Protein 7.6 6.5 - 8.1 g/dL   Albumin 4.3 3.5 - 5.0 g/dL   AST 24 15 - 41 U/L   ALT 17 0 - 44 U/L   Alkaline Phosphatase 68 38 - 126 U/L   Total Bilirubin 0.6 0.3 - 1.2 mg/dL   GFR, Estimated >60 >60 mL/min    Comment: (NOTE) Calculated using the CKD-EPI Creatinine Equation (2021)    Anion gap 4 (L) 5 - 15    Comment: Performed at [facility], [address], [place], [place] [address]  TSH      Status: None   Collection Time: 02/08/21 11:29 PM  Result Value Ref Range   TSH 1.673 0.350 - 4.500 uIU/mL    Comment: Performed by a 3rd Generation assay with a functional sensitivity of <=0.01 uIU/mL. Performed at [facility], [address] [place], [place] [address]   Resp Panel by RT-PCR (Flu A&B, Covid) Nasopharyngeal Swab     Status: None   Collection Time: 02/08/21 11:39 PM   Specimen: Nasopharyngeal Swab; Nasopharyngeal(NP) swabs in vial transport medium  Result Value Ref Range   SARS Coronavirus 2 by RT PCR NEGATIVE NEGATIVE    Comment: (NOTE) SARS-CoV-2 target nucleic acids are NOT DETECTED.  The SARS-CoV-2 RNA is generally detectable in upper respiratory specimens during the acute phase of infection. The lowest concentration of SARS-CoV-2 viral copies this assay can detect is 138 copies/mL. A negative result does not  preclude SARS-Cov-2 infection and should not be used as the sole basis for treatment or other patient management decisions. A negative result may occur with  improper specimen collection/handling, submission of specimen other than nasopharyngeal swab, presence of viral mutation(s) within the areas targeted by this assay, and inadequate number of viral copies(<138 copies/mL). A negative result must be combined with clinical observations, patient history, and epidemiological information. The expected result is Negative.  Fact Sheet for Patients:  EntrepreneurPulse.com.au  Fact Sheet for Healthcare Providers:  IncredibleEmployment.be  This test is no t yet approved or cleared by the [place] FDA and  has been authorized for detection and/or diagnosis of SARS-CoV-2 by FDA under an Emergency Use Authorization (EUA). This EUA will remain  in effect (meaning this test can be used) for the duration of the COVID-19 declaration under Section 564(b)(1) of the Act, 21 U.S.C.section 360bbb-3(b)(1), unless the authorization is  terminated  or revoked sooner.       Influenza A by PCR NEGATIVE NEGATIVE   Influenza B by PCR NEGATIVE NEGATIVE    Comment: (NOTE) The Xpert Xpress SARS-CoV-2/FLU/RSV plus assay is intended as an aid in the diagnosis of influenza from Nasopharyngeal swab specimens and should not be used as a sole basis for treatment. Nasal washings and aspirates are unacceptable for Xpert Xpress SARS-CoV-2/FLU/RSV testing.  Fact Sheet for Patients: EntrepreneurPulse.com.au  Fact Sheet for Healthcare Providers: IncredibleEmployment.be  This test is not yet approved or cleared by the [place] FDA and has been authorized for detection and/or diagnosis of SARS-CoV-2 by FDA under an Emergency Use Authorization (EUA). This EUA will remain in effect (meaning this test can be used) for the duration of the COVID-19 declaration under Section 564(b)(1) of the Act, 21 U.S.C. section 360bbb-3(b)(1), unless the authorization is terminated or revoked.  Performed at [facility], [address], [place], [place] [address]   CBG monitoring, ED     Status: Abnormal   Collection Time: 02/08/21 11:53 PM  Result Value Ref Range   Glucose-Capillary 110 (H) 70 - 99 mg/dL    Comment: Glucose reference range applies only to samples taken after fasting for at least 8 hours.  Ammonia     Status: None   Collection Time: 02/09/21 12:29 AM  Result Value Ref Range   Ammonia 13 9 - 35 umol/L    Comment: Performed at [facility], [address], [place], [place] [address]  Blood gas, venous (at [facility], not at [facility])     Status: Abnormal   Collection Time: 02/09/21 12:29 AM  Result Value Ref Range   FIO2 21.00    pH, Ven 7.394 7.250 - 7.430   pCO2, Ven 42.5 (L) 44.0 - 60.0 mmHg   pO2, Ven 34.1 32.0 - 45.0 mmHg   Bicarbonate 24.4 20.0 - 28.0 mmol/L   Acid-Base Excess 1.1 0.0 - 2.0 mmol/L   O2 Saturation 61.1 %   Patient temperature 36.5     Comment: Performed at [facility], [address] [place],  [address]    Chemistries  Recent Labs  Lab 02/08/21 2329  NA 136  K 3.9  CL 105  CO2 27  GLUCOSE 110*  BUN 16  CREATININE 0.99  CALCIUM 9.0  AST 24  ALT 17  ALKPHOS 68  BILITOT 0.6   ------------------------------------------------------------------------------------------------------------------  ------------------------------------------------------------------------------------------------------------------ GFR: Estimated Creatinine Clearance: 50 mL/min (by C-G formula based on SCr of 0.99 mg/dL). Liver Function Tests: Recent Labs  Lab 02/08/21 2329  AST 24  ALT 17  ALKPHOS 68  BILITOT 0.6  PROT 7.6  ALBUMIN 4.3   No results for input(s): LIPASE, AMYLASE in the last 168 hours. Recent Labs  Lab 02/09/21 0029  AMMONIA 13   Coagulation Profile: Recent Labs  Lab 02/08/21 2329  INR 0.9   Cardiac Enzymes: No results for input(s): CKTOTAL, CKMB, CKMBINDEX, TROPONINI in the last 168 hours. BNP (last 3 results) No results for input(s): PROBNP in the last 8760 hours. HbA1C: No results for input(s): HGBA1C in the last 72 hours. CBG: Recent Labs  Lab 02/08/21 2353  GLUCAP 110*   Lipid Profile: No results for input(s): CHOL, HDL, LDLCALC, TRIG, CHOLHDL, LDLDIRECT in the last 72 hours. Thyroid Function Tests: Recent Labs    02/08/21 2329  TSH 1.673   Anemia Panel: No results for input(s): VITAMINB12, FOLATE, FERRITIN, TIBC, IRON, RETICCTPCT in the last 72 hours.  --------------------------------------------------------------------------------------------------------------- Urine analysis:    Component Value Date/Time   COLORURINE YELLOW 05/15/2020 0804   APPEARANCEUR CLEAR 05/15/2020 0804   LABSPEC 1.024 05/15/2020 0804   PHURINE 5.5 05/15/2020 0804   GLUCOSEU NEGATIVE 05/15/2020 0804   HGBUR NEGATIVE 05/15/2020 0804   KETONESUR NEGATIVE 05/15/2020 0804   PROTEINUR NEGATIVE 05/15/2020 0804      Imaging  Results:    DG Chest Portable 1 View  Result Date: 02/09/2021 CLINICAL DATA:  Altered mental status EXAM: PORTABLE CHEST 1 VIEW COMPARISON:  03/12/2014 FINDINGS: Retrocardiac lucency represents a a moderate hiatal hernia, better seen on CT examination of 02/29/2020. Lungs are otherwise clear. No pneumothorax or pleural effusion. Cardiac size within normal limits. Pulmonary vascularity is normal. No acute bone abnormality. IMPRESSION: No active disease. Moderate hiatal hernia. Electronically Signed   By: Fidela Salisbury M.D.   On: 02/09/2021 00:09   CT HEAD CODE STROKE WO CONTRAST  Result Date: 02/09/2021 CLINICAL DATA:  Code stroke. EXAM: CT HEAD WITHOUT CONTRAST TECHNIQUE: Contiguous axial images were obtained from the base of the skull through the vertex without intravenous contrast. COMPARISON:  None. FINDINGS: Brain: There is no mass, hemorrhage or extra-axial collection. The size and configuration of the ventricles and extra-axial CSF spaces are normal. The brain parenchyma is normal, without evidence of acute or chronic infarction. Vascular: No abnormal hyperdensity of the major intracranial arteries or dural venous sinuses. No intracranial atherosclerosis. Skull: The visualized skull base, calvarium and extracranial soft tissues are normal. Sinuses/Orbits: No fluid levels or advanced mucosal thickening of the visualized paranasal sinuses. No mastoid or middle ear effusion. The orbits are normal. ASPECTS [facility] Stroke Program Early CT Score) - Ganglionic level infarction (caudate, lentiform nuclei, internal capsule, insula, M1-M3 cortex): 7 - Supraganglionic infarction (M4-M6 cortex): 3 Total score (0-10 with 10 being normal): 10 IMPRESSION: 1. Normal head CT. 2. ASPECTS is 10. These results were called by telephone at the time of interpretation on 02/09/2021 at 12:07 am to provider Carollee Leitz, who verbally acknowledged these results. Electronically Signed   By: Ulyses Jarred M.D.   On: 02/09/2021  00:08      Assessment & Plan:    Active Problems:   Abdominal pain   Acute metabolic encephalopathy   Hypertension complicating diabetes ([place])   Acute metabolic encephalopathy Resolved by the time of my exam TSH is normal, electrolytes are normal, EKG is normal, very low suspicion for infection at this time Alcohol level not detectable, ammonia level is 13 Code stroke was called but more likely to be something metabolic or infectious.  Neurology has recommended MRI and EEG  in the a.m., these have been ordered Holding amitriptyline as it is a new medication could be contributing to her altered mental status Abdominal pain Continue home meds and Bentyl Continue amlodipine   DVT Prophylaxis-   Lovenox - SCDs   AM Labs Ordered, also please review Full Orders  Family Communication: No family at bedside.  Code Status: Full  Admission status: Observation  Time spent in minutes : [place]

## 2021-02-09 NOTE — Consult Note (Signed)
[facility] Tele[facility] Consult Services   Date of Service:   02/08/2021 23:45:05  Diagnosis:       G94.3 - Encephalopathy in diseases classified elsewhere  Impression:      37 F woke up "delusional" and with gait abnormality. Here in the ED Krystal Morris symptoms have resolved and Krystal Morris just has Krystal flat affect but otherwise NIH ) and non-focal exam. No hx sz. Rec toxic-metabolic-infectious workup and if no cause for symptoms can be identified rec admission for MRI brain wwo, rEEG as differential includes TIA, behavioral disturbance, sz . Would also start ASA 81 mg (if admitted).  Metrics: Last Known Well: 02/08/2021 18:00:00 [facility] Notification Time: 02/08/2021 23:44:02 Arrival Time: 02/08/2021 22:56:00 Stamp Time: 02/08/2021 23:45:05 Initial Response Time: 02/08/2021 23:49:00 Symptoms: AMS and difficulty walking. NIHSS Start Assessment Time: 02/08/2021 23:49:00 Krystal Morris is not Krystal candidate for Thrombolytic. Thrombolytic Medical Decision: 02/08/2021 23:55:01 Krystal Morris was not deemed candidate for Thrombolytic because of following reasons: Resolved symptoms (no residual disabling symptoms).  CT head was reviewed and results were: I personally Reviewed the CT Head and it Showed no ICH  ED Physician notified of diagnostic impression and management plan on 02/09/2021 00:19:23  Advanced Imaging: Advanced Imaging Not Completed because:  Symptoms resolved, low suspicion for CVA   Sign Out:       Discussed with Emergency Department Provider    ------------------------------------------------------------------------------  History of Present Illness: Krystal Morris is Krystal Morris.  Krystal Morris was brought by private transportation with symptoms of AMS and difficulty walking.  18 F woke up delusional and then when Krystal Morris went to got up from bed Krystal Morris was staggering per daughter at bedside (who gives the history as Krystal Morris doesn't remember the event). No DME at home. Krystal Morris was  saying that Krystal Morris couldn't going to bed because of "stuff in Krystal Morris hands" or "had to go to work". No hx sz. No new meds or fever, SOB, CP. LKN ~6 pm per daughetr at bedside. No episodes like this have happened before. No hx dementia/memory issues. In the ED now Krystal Morris is much better.   Past Medical History:      Hypertension      There is NO history of Diabetes Mellitus      There is NO history of Atrial Fibrillation      There is NO history of Coronary Artery Disease      There is NO history of Stroke  Social History:Unable to obtain due to Krystal Morris Status  Family History:Unable to obtain due to Krystal Morris Status  Review of System:  14 Points Review of Systems was performed and was negative except mentioned in HPI.  Anticoagulant use:  No  Antiplatelet use: No  Allergies:  Reviewed    Examination: BP(139/76), Pulse(94), Blood Glucose(110) 1A: Level of Consciousness - Alert; keenly responsive + 0 1B: Ask Month and Age - Both Questions Right + 0 1C: Blink Eyes & Squeeze Hands - Performs Both Tasks + 0 2: Test Horizontal Extraocular Movements - Normal + 0 3: Test Visual Fields - No Visual Loss + 0 4: Test Facial Palsy (Use Grimace if Obtunded) - Normal symmetry + 0 5A: Test Left Arm Motor Drift - No Drift for 10 Seconds + 0 5B: Test Right Arm Motor Drift - No Drift for 10 Seconds + 0 6A: Test Left Leg Motor Drift - No Drift for 5 Seconds + 0 6B: Test Right Leg Motor Drift - No Drift for 5 Seconds + 0 7: Test Limb Ataxia (FNF/Heel-Shin) -  No Ataxia + 0 8: Test Sensation - Normal; No sensory loss + 0 9: Test Language/Aphasia - Normal; No aphasia + 0 10: Test Dysarthria - Normal + 0 11: Test Extinction/Inattention - No abnormality + 0  NIHSS Score: 0   Pre-Morbid Modified Rankin Scale: 0 Points = No symptoms at all   Krystal Morris was informed the Neurology Consult would occur via TeleHealth consult by way of interactive audio and video telecommunications and consented to  receiving care in this manner.   Krystal Morris is being evaluated for possible acute neurologic impairment and high probability of imminent or life-threatening deterioration. I spent total of 32 minutes providing care to this Krystal Morris, including time for face to face visit via telemedicine, review of medical records, imaging studies and discussion of findings with providers, the Krystal Morris and/or family.   Dr Deitra Mayo   [facility] [phone]  Case [id number]

## 2021-02-09 NOTE — Progress Notes (Signed)
2354 call time  2341 beeper time 2359 exam started 0000 exam finished 0001 images sent to soc 0002 exam completed in epic 0002 [facility] radiology called

## 2021-02-12 LAB — VITAMIN B1: Vitamin B1 (Thiamine): 70.5 nmol/L (ref 66.5–200.0)

## 2021-03-27 ENCOUNTER — Other Ambulatory Visit (HOSPITAL_COMMUNITY): Payer: Self-pay | Admitting: Family Medicine

## 2021-03-27 DIAGNOSIS — Z1231 Encounter for screening mammogram for malignant neoplasm of breast: Secondary | ICD-10-CM

## 2021-03-30 ENCOUNTER — Encounter: Payer: Self-pay | Admitting: Internal Medicine

## 2021-03-30 ENCOUNTER — Other Ambulatory Visit (HOSPITAL_COMMUNITY): Payer: Self-pay | Admitting: Family Medicine

## 2021-03-30 DIAGNOSIS — R102 Pelvic and perineal pain: Secondary | ICD-10-CM

## 2021-03-31 ENCOUNTER — Other Ambulatory Visit (HOSPITAL_COMMUNITY): Payer: Self-pay | Admitting: Family Medicine

## 2021-03-31 DIAGNOSIS — R1031 Right lower quadrant pain: Secondary | ICD-10-CM

## 2021-04-10 ENCOUNTER — Other Ambulatory Visit: Payer: Self-pay

## 2021-04-10 ENCOUNTER — Ambulatory Visit (HOSPITAL_COMMUNITY)
Admission: RE | Admit: 2021-04-10 | Discharge: 2021-04-10 | Disposition: A | Discharge status: Home / Self Care | Payer: Medicare Other | Source: Ambulatory Visit | Attending: Family Medicine | Admitting: Family Medicine

## 2021-04-10 ENCOUNTER — Encounter (HOSPITAL_COMMUNITY): Payer: Self-pay

## 2021-04-10 ENCOUNTER — Ambulatory Visit (HOSPITAL_COMMUNITY): Admission: RE | Admit: 2021-04-10 | Payer: Medicare Other | Source: Ambulatory Visit

## 2021-04-10 DIAGNOSIS — R102 Pelvic and perineal pain: Secondary | ICD-10-CM | POA: Diagnosis not present

## 2021-07-06 ENCOUNTER — Other Ambulatory Visit (HOSPITAL_COMMUNITY): Payer: Self-pay | Admitting: Family Medicine

## 2021-07-06 DIAGNOSIS — Z1231 Encounter for screening mammogram for malignant neoplasm of breast: Secondary | ICD-10-CM

## 2021-07-06 DIAGNOSIS — R1031 Right lower quadrant pain: Secondary | ICD-10-CM

## 2021-07-24 ENCOUNTER — Ambulatory Visit (HOSPITAL_COMMUNITY)
Admission: RE | Admit: 2021-07-24 | Discharge: 2021-07-24 | Disposition: A | Discharge status: Home / Self Care | Payer: Medicare Other | Source: Ambulatory Visit | Attending: Family Medicine | Admitting: Family Medicine

## 2021-07-24 ENCOUNTER — Other Ambulatory Visit: Payer: Self-pay

## 2021-07-24 DIAGNOSIS — Z1231 Encounter for screening mammogram for malignant neoplasm of breast: Secondary | ICD-10-CM | POA: Diagnosis present

## 2021-08-06 ENCOUNTER — Ambulatory Visit: Payer: Medicare Other | Admitting: Gastroenterology

## 2021-08-26 ENCOUNTER — Encounter: Payer: Self-pay | Admitting: Internal Medicine

## 2021-11-06 ENCOUNTER — Ambulatory Visit: Payer: Medicare Other | Admitting: Gastroenterology

## 2021-11-22 NOTE — Progress Notes (Deleted)
Referring Provider: Dorothyann Peng, FNP Primary Care Physician:  Dorothyann Peng, FNP Primary GI Physician: Dr. Abbey Chatters  No chief complaint on file.   HPI:   Krystal Morris is a 71 y.o. female presenting today for follow-up.  She has history of IBS, chronic abdominal pain, primarily in the lower abdomen.  Prior evaluation with CT A/P October 2021 relatively unremarkable besides diverticulosis.  Colonoscopy November 2021 unremarkable besides diverticulosis and multiple small tubular adenomas removed with a 5-year recall.  Random colon biopsies were negative. Pelvic US November 2022 with evidence of prior hysterectomy, no ovaries visualized, no acute pathology. She has been trialed on dicyclomine without improvement.    Last seen in our office 01/14/2021.  Continued with lower abdominal pain on a regular basis.  Had an acute episode approximately 1 month prior with some associated diarrhea, but diarrhea resolved.  She was having regular bowel movements. Overall felt symptoms may be related to IBS/visceral hypersensitivity, possibly adhesions. Planned to trial amitryptiline and add benefiber, follow-up in 3 months.   Today:   Past Medical History:  Diagnosis Date   Anxiety    Diverticulosis    Mild AC simple adenoma small interal hemorrhoids moderate-sized HH mild gastritis and duodenitis   GERD (gastroesophageal reflux disease)    Epigastric pain   Irritable bowel syndrome    Ulcer     Past Surgical History:  Procedure Laterality Date   ABDOMINAL HYSTERECTOMY     BIOPSY  03/24/2020   Procedure: BIOPSY;  Surgeon: Eloise Harman, DO;  [facility]: [facility];  Service: Endoscopy;;   CARDIAC SURGERY     CATARACT EXTRACTION W/PHACO Right 05/01/2018   Procedure: CATARACT EXTRACTION PHACO AND INTRAOCULAR LENS PLACEMENT ([place]);  Surgeon: Tonny Branch, MD;  Location: [facility];  Service: Ophthalmology;  Laterality: Right;  CDE: 11.14   CATARACT EXTRACTION W/PHACO Left 05/15/2018    Procedure: CATARACT EXTRACTION PHACO AND INTRAOCULAR LENS PLACEMENT ([facility]);  Surgeon: Tonny Branch, MD;  Location: [facility];  Service: Ophthalmology;  Laterality: Left;  CDE: 12.79   COLONOSCOPY  02/12/2008   Fields-Simple adenoma. Small int hemorrhoids   COLONOSCOPY  01/28/2012   Formed stool in MID-TRANSVERSE colon Mild diverticulosis was noted in the sigmoid colon.  , and Moderate sized hemorrhoids were found   COLONOSCOPY  02/22/2012   Three sessile polyps ranging between 3-61m in size were found in the descending colon (tubular adenomas); polypectomy was performed/Mild diverticulosis was noted in the sigmoid colon Moderate sized internal hemorrhoids-CAUSING RECTAL BLEEDING-S/P HEMORRHOID BANDINGx2. recommended 10 year f/u tcs   COLONOSCOPY WITH PROPOFOL N/A 03/24/2020   Non-bleeding internal hemorrhoids. Multiple diverticula. Three sessile polyps 5-8 mm in size in ascendign colon. 1 mm polyp in ascending colon that was sessile. Tubular adenomas. Negative colonic biopsies. 5 year surveillance.    ESOPHAGOGASTRODUODENOSCOPY  02/12/2008   Fields- Nl duodenum/chronic active gastritis   FLEXIBLE SIGMOIDOSCOPY N/A 08/13/2019   moderate external hemorrhoids, small internal hemorrhoids, moderate diverticulosis in rectosigmoid and sigmoid colon.    PARTIAL HYSTERECTOMY     POLYPECTOMY  03/24/2020   Procedure: POLYPECTOMY;  Surgeon: CEloise Harman DO;  [facility]: [facility];  Service: Endoscopy;;   Thyroid Growth     Removal   TUBAL LIGATION     UMBILICAL HERNIA REPAIR      Current Outpatient Medications  Medication Sig Dispense Refill   acetaminophen (TYLENOL) 500 MG tablet Take 1,000 mg by mouth every 6 (six) hours as needed for headache.  amLODipine (NORVASC) 2.5 MG tablet Take 2.5 mg by mouth daily.     dicyclomine (BENTYL) 10 MG capsule Take 10 mg by mouth 3 (three) times daily as needed (abdominal cramping).     Multiple Vitamins-Minerals (MULTIVITAMIN WITH MINERALS) tablet Take 1 tablet  by mouth 2 (two) times a week.     No current facility-administered medications for this visit.    Allergies as of 11/23/2021 - Review Complete 02/09/2021  Allergen Reaction Noted   Penicillins Rash     Family History  Problem Relation Age of Onset   Cancer Son    COPD Mother    COPD Father    Drug abuse Sister        medication overdose   Coronary artery disease Son    Colon polyps Daughter     Social History   Socioeconomic History   Marital status: Divorced    Spouse name: Not on file   Number of children: 3   Years of education: Not on file   Highest education level: Not on file  Occupational History   Occupation: Barrister's clerk: [employer]  Tobacco Use   Smoking status: Never   Smokeless tobacco: Never   Tobacco comments:    Never smoked  Vaping Use   Vaping Use: Never used  Substance and Sexual Activity   Alcohol use: No    Alcohol/week: 0.0 standard drinks of alcohol   Drug use: No   Sexual activity: Not Currently    Birth control/protection: Surgical  Other Topics Concern   Not on file  Social History Narrative   Lost 1 son, 3 living   Social Determinants of Health   Financial Resource Strain: Not on file  Food Insecurity: Not on file  Transportation Needs: Not on file  Physical Activity: Not on file  Stress: Not on file  Social Connections: Not on file    Review of Systems: Gen: Denies fever, chills, cold or flulike symptoms, presyncope, syncope. CV: Denies chest pain, palpitations. Resp: Denies dyspnea at rest, cough. GI: See HPI Heme: See HPI  Physical Exam: There were no vitals taken for this visit. General:   Alert and oriented. No distress noted. Pleasant and cooperative.  Head:  Normocephalic and atraumatic. Eyes:  Conjuctiva clear without scleral icterus. Heart:  S1, S2 present without murmurs appreciated. Lungs:  Clear to auscultation bilaterally. No wheezes, rales, or rhonchi. No distress.  Abdomen:  +BS, soft,  non-tender and non-distended. No rebound or guarding. No HSM or masses noted. Msk:  Symmetrical without gross deformities. Normal posture. Extremities:  Without edema. Neurologic:  Alert and  oriented x4 Psych:  Normal mood and affect.    Assessment:     Plan:  ***   Aliene Altes, PA-C [facility]astro[facility] 11/23/2021

## 2021-11-23 ENCOUNTER — Encounter: Payer: Self-pay | Admitting: Internal Medicine

## 2021-11-23 ENCOUNTER — Ambulatory Visit: Payer: Medicare Other | Admitting: Gastroenterology

## 2022-07-21 NOTE — Progress Notes (Unsigned)
Office Visit Note  Patient: Krystal Morris             Date of Birth: Jan 03, 1951           MRN: [id number]             PCP: Dorothyann Peng, FNP Referring: Dorothyann Peng, FNP Visit Date: 07/22/2022 Occupation: Glass blower/designer  Subjective:  Establish Care   History of Present Illness: Krystal Morris is a 72 y.o. female here for evaluation of positive ANA associated with fatigue and body aches.  Problems are noticeable since at least the past year.  She recalls proceeding changes included COVID vaccines and starting atorvastatin but she discontinued the medicine after a short time.  Lab testing for the symptoms about a year ago showed positive ANA with highly positive RNP titer otherwise negative inflammatory markers.  She started on daily oral prednisone since June last year initially at 10 mg and 20 mg dose.  She initially noticed weight gain which was an improvement due to her chronically poor appetite but lost the weight again within the past few months.  She tried taking herself off the prednisone but felt an increase in generalized body aches and stiffness shortly when she stopped this.  In the past few weeks worse trouble is in her anterior thighs and low back and hip most noticeable while she is working which involves prolonged standing and walking. Besides the body aches she has not noticed any new skin rashes, visible joint swelling, photosensitivity, alopecia, or Raynaud's symptoms.  No history of abnormal bleeding or blood clots. She has some chronic constipation no history of inflammatory bowel disease.  Previously had evaluation for severe GERD and painful swallowing possibly contributing with chronic poor appetite and difficulty maintaining weight.   Activities of Daily Living:  Patient reports morning stiffness for 5-10 minutes.   Patient Denies nocturnal pain.  Difficulty dressing/grooming: Denies Difficulty climbing stairs: Denies Difficulty getting out of chair:  Denies Difficulty using hands for taps, buttons, cutlery, and/or writing: Denies  Review of Systems  Constitutional:  Positive for fatigue.  HENT:  Positive for mouth dryness. Negative for mouth sores.   Eyes: Negative.  Negative for dryness.  Respiratory: Negative.  Negative for shortness of breath.   Cardiovascular: Negative.  Negative for chest pain and palpitations.  Gastrointestinal:  Positive for constipation. Negative for blood in stool and diarrhea.  Endocrine: Negative.  Negative for increased urination.  Genitourinary: Negative.  Negative for involuntary urination.  Musculoskeletal:  Positive for joint pain, joint pain, muscle weakness, morning stiffness and muscle tenderness. Negative for gait problem, joint swelling, myalgias and myalgias.  Skin: Negative.  Negative for color change, rash, hair loss and sensitivity to sunlight.  Allergic/Immunologic: Negative.  Negative for susceptible to infections.  Neurological:  Positive for headaches. Negative for dizziness.  Hematological: Negative.  Negative for swollen glands.  Psychiatric/Behavioral:  Positive for depressed mood and sleep disturbance. The patient is not nervous/anxious.     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[place]) 02/09/2021   Bladder wall thickening 05/07/2020   Anal or rectal pain    Abdominal pain 07/26/2019   RLQ abdominal pain 05/02/2019   Left buttock pain 05/02/2019   Rectal bleeding 01/17/2018   Poor appetite 01/17/2018   Abdominal pain, epigastric 01/02/2015   Odynophagia  01/02/2015   GAD (generalized anxiety disorder) 04/03/2013   Internal hemorrhoids with complication [id number]   IBS 11/18/2009   HIP PAIN, LEFT 09/24/2008   GERD, SEVERE 08/22/2008    Past Medical History:  Diagnosis Date   Anxiety     Diverticulosis    Mild AC simple adenoma small interal hemorrhoids moderate-sized HH mild gastritis and duodenitis   GERD (gastroesophageal reflux disease)    Epigastric pain   Irritable bowel syndrome    Ulcer     Family History  Problem Relation Age of Onset   COPD Mother    COPD Father    Drug abuse Sister        medication overdose   High blood pressure Sister    High blood pressure Sister    High blood pressure Sister    Colon polyps Daughter    Cancer Son    Coronary artery disease Son    Past Surgical History:  Procedure Laterality Date   ABDOMINAL HYSTERECTOMY     BIOPSY  03/24/2020   Procedure: BIOPSY;  Surgeon: Eloise Harman, DO;  [facility];  Service: Endoscopy;;   CARDIAC SURGERY     CATARACT EXTRACTION W/PHACO Right 05/01/2018   Procedure: CATARACT EXTRACTION PHACO AND INTRAOCULAR LENS PLACEMENT ([place]);  Surgeon: Tonny Branch, MD;  Location: [facility];  Service: Ophthalmology;  Laterality: Right;  CDE: 11.14   CATARACT EXTRACTION W/PHACO Left 05/15/2018   Procedure: CATARACT EXTRACTION PHACO AND INTRAOCULAR LENS PLACEMENT ([facility]);  Surgeon: Tonny Branch, MD;  Location: [facility];  Service: Ophthalmology;  Laterality: Left;  CDE: 12.79   COLONOSCOPY  02/12/2008   Fields-Simple adenoma. Small int hemorrhoids   COLONOSCOPY  01/28/2012   Formed stool in MID-TRANSVERSE colon Mild diverticulosis was noted in the sigmoid colon.  , and Moderate sized hemorrhoids were found   COLONOSCOPY  02/22/2012   Three sessile polyps ranging between 3-50m in size were found in the descending colon (tubular adenomas); polypectomy was performed/Mild diverticulosis was noted in the sigmoid colon Moderate sized internal hemorrhoids-CAUSING RECTAL BLEEDING-S/P HEMORRHOID BANDINGx2. recommended 10 year f/u tcs   COLONOSCOPY WITH PROPOFOL N/A 03/24/2020   Non-bleeding internal hemorrhoids. Multiple diverticula. Three sessile polyps 5-8 mm in size in ascendign colon. 1 mm polyp in ascending  colon that was sessile. Tubular adenomas. Negative colonic biopsies. 5 year surveillance.    ESOPHAGOGASTRODUODENOSCOPY  02/12/2008   Fields- Nl duodenum/chronic active gastritis   FLEXIBLE SIGMOIDOSCOPY N/A 08/13/2019   moderate external hemorrhoids, small internal hemorrhoids, moderate diverticulosis in rectosigmoid and sigmoid colon.    PARTIAL HYSTERECTOMY     POLYPECTOMY  03/24/2020   Procedure: POLYPECTOMY;  Surgeon: CEloise Harman DO;  [facility];  Service: Endoscopy;;   Thyroid Growth     Removal   TUBAL LIGATION     UMBILICAL HERNIA REPAIR     Social History   Social History Narrative   Lost 1 son, 3 living    There is no immunization history on file for this patient.   Objective: Vital Signs: BP 133/82 (BP Location: Right Arm, Patient Position: Sitting, Cuff Size: Large)   Pulse 91   Resp 16   Ht 5' 5.5" (1.664 m)   Wt 158 lb 6.4 oz (71.8 kg)   BMI 25.96 kg/m    Physical Exam HENT:     Mouth/Throat:     Mouth: Mucous membranes are moist.     Pharynx: Oropharynx is clear.  Eyes:     Conjunctiva/sclera: Conjunctivae normal.  Cardiovascular:     Rate and Rhythm: Normal rate and regular rhythm.  Pulmonary:     Effort: Pulmonary effort is normal.     Breath sounds: Normal breath sounds.  Musculoskeletal:     Right lower leg: No edema.     Left lower leg: No edema.  Lymphadenopathy:     Cervical: No cervical adenopathy.  Skin:    General: Skin is warm and dry.  Neurological:     Mental Status: She is alert.     Motor: No weakness.     Deep Tendon Reflexes: Reflexes normal.  Psychiatric:        Mood and Affect: Mood normal.      Musculoskeletal Exam:  Neck full ROM no tenderness Shoulders full ROM no tenderness or swelling Elbows full ROM no tenderness or swelling Wrists full ROM no tenderness or swelling Fingers full ROM no tenderness or swelling, mild 1st CMC joint squaring, 2nd DIP bony nodules No paraspinal tenderness to palpation  over upper and lower back Hip normal internal and external rotation without pain, no tenderness to lateral hip palpation Knees full ROM no tenderness or swelling Ankles full ROM no tenderness or swelling   Investigation: No additional findings.  Imaging: No results found.  Recent Labs: Lab Results  Component Value Date   WBC 4.2 02/09/2021   HGB 12.3 02/09/2021   PLT 201 02/09/2021   NA 141 02/09/2021   K 4.0 02/09/2021   CL 109 02/09/2021   CO2 23 02/09/2021   GLUCOSE 89 02/09/2021   BUN 14 02/09/2021   CREATININE 0.81 02/09/2021   BILITOT 0.7 02/09/2021   ALKPHOS 59 02/09/2021   AST 21 02/09/2021   ALT 14 02/09/2021   PROT 6.3 (L) 02/09/2021   ALBUMIN 3.6 02/09/2021   CALCIUM 8.8 (L) 02/09/2021   GFRAA 87 02/11/2020    Speciality Comments: No specialty comments available.  Procedures:  No procedures performed Allergies: Penicillins   Assessment / Plan:     Visit Diagnoses: Positive ANA (antinuclear antibody) - Plan: Sedimentation rate, C-reactive protein, CK, C3 and C4  Positive ANA with high RNP antibody titer no specific clinical findings of inflammation or peripheral joint synovitis on exam today.  May be masked by current prednisone controlling active inflammation.  Checking sed rate CRP serum complements and CK level for any indications of active inflammation process currently.  Will need to titrate current prednisone dose to see if there is any unmasking of symptoms or abnormal lab changes with follow-up.  Plan to decrease starting at 20 mg down 5 mg/day every 2 weeks and follow-up in about 6 weeks.  Body aches - Plan: predniSONE (DELTASONE) 5 MG tablet  Poor appetite  Unclear though reporting worsening on prolonged daily prednisone concerned this might be related to her previous history of severe GERD.  Planning to taper medication dose and see if this impacts her symptoms.  Orders: Orders Placed This Encounter  Procedures   Sedimentation rate    C-reactive protein   CK   C3 and C4   Meds ordered this encounter  Medications   predniSONE (DELTASONE) 5 MG tablet    Sig: Take 3 tablets (15 mg total) by mouth daily with breakfast for 14 days, THEN 2 tablets (10 mg total) daily with breakfast for 14 days, THEN 1 tablet (5 mg total) daily with breakfast for 14 days.    Dispense:  84 tablet    Refill:  0     Follow-Up Instructions: Return in  about 6 weeks (around 09/02/2022) for New pt ?ANA GC taper f/u 6wks.   Collier Salina, MD  Note - This record has been created using [employer].  Chart creation errors have been sought, but may not always  have been located. Such creation errors do not reflect on  the standard of medical care.

## 2022-07-22 ENCOUNTER — Encounter: Payer: Self-pay | Admitting: Internal Medicine

## 2022-07-22 ENCOUNTER — Ambulatory Visit: Payer: Medicare Other | Attending: Internal Medicine | Admitting: Internal Medicine

## 2022-07-22 VITALS — BP 133/82 | HR 91 | Resp 16 | Ht 65.5 in | Wt 158.4 lb

## 2022-07-22 DIAGNOSIS — R630 Anorexia: Secondary | ICD-10-CM

## 2022-07-22 DIAGNOSIS — R52 Pain, unspecified: Secondary | ICD-10-CM

## 2022-07-22 DIAGNOSIS — R768 Other specified abnormal immunological findings in serum: Secondary | ICD-10-CM | POA: Diagnosis not present

## 2022-07-22 MED ORDER — PREDNISONE 5 MG PO TABS: ORAL_TABLET | ORAL | 0 refills | Status: AC

## 2022-07-22 NOTE — Patient Instructions (Addendum)
I recommend starting to taper prednisone dose and sent new prescription for 5 mg tablets. Keep taking the current 20 mg tablets once daily until we contact you about results being okay. Decrease every 2 weeks to 15 mg, 10 mg, then 5 mg daily. If you notice severe worsening of symptoms can go back a step and stay there till we follow up or contact our office

## 2022-07-23 LAB — CK: Total CK: 73 U/L (ref 29–143)

## 2022-07-23 LAB — C3 AND C4
C3 Complement: 159 mg/dL (ref 83–193)
C4 Complement: 33 mg/dL (ref 15–57)

## 2022-07-23 LAB — C-REACTIVE PROTEIN: CRP: 2.1 mg/L (ref <8.0)

## 2022-07-23 LAB — SEDIMENTATION RATE: Sed Rate: 2 mm/h (ref 0–30)

## 2022-07-23 NOTE — Progress Notes (Signed)
Blood tests for inflammation are normal at this time so I recommend she can try tapering down the prednisone as planned decreasing by 5 mg increments every 2 weeks starting by going down to 15 mg daily.

## 2022-09-03 ENCOUNTER — Other Ambulatory Visit (HOSPITAL_COMMUNITY): Payer: Self-pay | Admitting: Family Medicine

## 2022-09-03 DIAGNOSIS — Z1231 Encounter for screening mammogram for malignant neoplasm of breast: Secondary | ICD-10-CM

## 2022-09-05 NOTE — Progress Notes (Deleted)
Office Visit Note  Patient: Krystal Morris             Date of Birth: August 17, 1950           MRN: [id number]             PCP: Catha Nottingham, FNP Referring: Catha Nottingham, FNP Visit Date: 09/06/2022   Subjective:  No chief complaint on file.   History of Present Illness: Krystal Morris is a 72 y.o. female here for follow up ***   Previous HPI 07/22/22 Krystal Morris is a 72 y.o. female here for evaluation of positive ANA associated with fatigue and body aches.  Problems are noticeable since at least the past year.  She recalls proceeding changes included COVID vaccines and starting atorvastatin but she discontinued the medicine after a short time.  Lab testing for the symptoms about a year ago showed positive ANA with highly positive RNP titer otherwise negative inflammatory markers.  She started on daily oral prednisone since June last year initially at 10 mg and 20 mg dose.  She initially noticed weight gain which was an improvement due to her chronically poor appetite but lost the weight again within the past few months.  She tried taking herself off the prednisone but felt an increase in generalized body aches and stiffness shortly when she stopped this.  In the past few weeks worse trouble is in her anterior thighs and low back and hip most noticeable while she is working which involves prolonged standing and walking. Besides the body aches she has not noticed any new skin rashes, visible joint swelling, photosensitivity, alopecia, or Raynaud's symptoms.  No history of abnormal bleeding or blood clots. She has some chronic constipation no history of inflammatory bowel disease.  Previously had evaluation for severe GERD and painful swallowing possibly contributing with chronic poor appetite and difficulty maintaining weight.   No Rheumatology ROS completed.   PMFS History:  Patient Active Problem List   Diagnosis Date Noted   Positive ANA (antinuclear antibody) 07/22/2022   Body  aches 07/22/2022   Acute metabolic encephalopathy 02/09/2021   Hypertension complicating diabetes 02/09/2021   Bladder wall thickening 05/07/2020   Anal or rectal pain    Abdominal pain 07/26/2019   RLQ abdominal pain 05/02/2019   Left buttock pain 05/02/2019   Rectal bleeding 01/17/2018   Poor appetite 01/17/2018   Abdominal pain, epigastric 01/02/2015   Odynophagia 01/02/2015   GAD (generalized anxiety disorder) 04/03/2013   Internal hemorrhoids with complication 12/27/2011   IBS 11/18/2009   HIP PAIN, LEFT 09/24/2008   GERD, SEVERE 08/22/2008    Past Medical History:  Diagnosis Date   Anxiety    Diverticulosis    Mild AC simple adenoma small interal hemorrhoids moderate-sized HH mild gastritis and duodenitis   GERD (gastroesophageal reflux disease)    Epigastric pain   Irritable bowel syndrome    Ulcer     Family History  Problem Relation Age of Onset   COPD Mother    COPD Father    Drug abuse Sister        medication overdose   High blood pressure Sister    High blood pressure Sister    High blood pressure Sister    Colon polyps Daughter    Cancer Son    Coronary artery disease Son    Past Surgical History:  Procedure Laterality Date   ABDOMINAL HYSTERECTOMY     BIOPSY  03/24/2020   Procedure: BIOPSY;  Surgeon: Lanelle Bal, DO;  Location: [facility];  Service: Endoscopy;;   CARDIAC SURGERY     CATARACT EXTRACTION W/PHACO Right 05/01/2018   Procedure: CATARACT EXTRACTION PHACO AND INTRAOCULAR LENS PLACEMENT (IOC);  Surgeon: Gemma Payor, MD;  Location: [facility];  Service: Ophthalmology;  Laterality: Right;  CDE: 11.14   CATARACT EXTRACTION W/PHACO Left 05/15/2018   Procedure: CATARACT EXTRACTION PHACO AND INTRAOCULAR LENS PLACEMENT (IOC);  Surgeon: Gemma Payor, MD;  Location: [facility];  Service: Ophthalmology;  Laterality: Left;  CDE: 12.79   COLONOSCOPY  02/12/2008   Fields-Simple adenoma. Small int hemorrhoids   COLONOSCOPY  01/28/2012   Formed stool in  MID-TRANSVERSE colon Mild diverticulosis was noted in the sigmoid colon.  , and Moderate sized hemorrhoids were found   COLONOSCOPY  02/22/2012   Three sessile polyps ranging between 3-75mm in size were found in the descending colon (tubular adenomas); polypectomy was performed/Mild diverticulosis was noted in the sigmoid colon Moderate sized internal hemorrhoids-CAUSING RECTAL BLEEDING-S/P HEMORRHOID BANDINGx2. recommended 10 year f/u tcs   COLONOSCOPY WITH PROPOFOL N/A 03/24/2020   Non-bleeding internal hemorrhoids. Multiple diverticula. Three sessile polyps 5-8 mm in size in ascendign colon. 1 mm polyp in ascending colon that was sessile. Tubular adenomas. Negative colonic biopsies. 5 year surveillance.    ESOPHAGOGASTRODUODENOSCOPY  02/12/2008   Fields- Nl duodenum/chronic active gastritis   FLEXIBLE SIGMOIDOSCOPY N/A 08/13/2019   moderate external hemorrhoids, small internal hemorrhoids, moderate diverticulosis in rectosigmoid and sigmoid colon.    PARTIAL HYSTERECTOMY     POLYPECTOMY  03/24/2020   Procedure: POLYPECTOMY;  Surgeon: Lanelle Bal, DO;  Location: [facility];  Service: Endoscopy;;   Thyroid Growth     Removal   TUBAL LIGATION     UMBILICAL HERNIA REPAIR     Social History   Social History Narrative   Lost 1 son, 3 living    There is no immunization history on file for this patient.   Objective: Vital Signs: There were no vitals taken for this visit.   Physical Exam   Musculoskeletal Exam: ***  CDAI Exam: CDAI Score: -- Patient Global: --; Provider Global: -- Swollen: --; Tender: -- Joint Exam 09/06/2022   No joint exam has been documented for this visit   There is currently no information documented on the homunculus. Go to the Rheumatology activity and complete the homunculus joint exam.  Investigation: No additional findings.  Imaging: No results found.  Recent Labs: Lab Results  Component Value Date   WBC 4.2 02/09/2021   HGB 12.3 02/09/2021    PLT 201 02/09/2021   NA 141 02/09/2021   K 4.0 02/09/2021   CL 109 02/09/2021   CO2 23 02/09/2021   GLUCOSE 89 02/09/2021   BUN 14 02/09/2021   CREATININE 0.81 02/09/2021   BILITOT 0.7 02/09/2021   ALKPHOS 59 02/09/2021   AST 21 02/09/2021   ALT 14 02/09/2021   PROT 6.3 (L) 02/09/2021   ALBUMIN 3.6 02/09/2021   CALCIUM 8.8 (L) 02/09/2021   GFRAA 87 02/11/2020    Speciality Comments: No specialty comments available.  Procedures:  No procedures performed Allergies: Penicillins   Assessment / Plan:     Visit Diagnoses: No diagnosis found.  ***  Orders: No orders of the defined types were placed in this encounter.  No orders of the defined types were placed in this encounter.    Follow-Up Instructions: No follow-ups on file.   Fuller Plan, MD  Note - This record has been  created using Dragon software.  Chart creation errors have been sought, but may not always  have been located. Such creation errors do not reflect on  the standard of medical care.  

## 2022-09-06 ENCOUNTER — Ambulatory Visit: Payer: Medicare Other | Admitting: Internal Medicine

## 2022-09-13 ENCOUNTER — Ambulatory Visit (HOSPITAL_COMMUNITY): Payer: Medicare Other

## 2022-09-13 ENCOUNTER — Telehealth: Payer: Self-pay | Admitting: Internal Medicine

## 2022-09-13 DIAGNOSIS — R52 Pain, unspecified: Secondary | ICD-10-CM

## 2022-09-13 DIAGNOSIS — R768 Other specified abnormal immunological findings in serum: Secondary | ICD-10-CM

## 2022-09-13 NOTE — Telephone Encounter (Unsigned)
Patient called to reschedule her follow-up appointment.  Patient states she is still experiencing pain on right leg from thigh to calf and left knee from shingles.  Patient was prescribed medication and wanted to know if there was something Dr. Dimple Casey could prescribe for the pain.  Patient states she will also call her PCP.

## 2022-09-13 NOTE — Telephone Encounter (Signed)
After reviewing the chart, you do not prescribe the patient any current medications. Please advise.

## 2022-09-14 NOTE — Telephone Encounter (Signed)
Attempted to contact the patient and left a message to call the office back. 

## 2022-09-14 NOTE — Telephone Encounter (Signed)
We were prescribing her prednisone and tapered down from 20 mg over time. She did not come to the scheduled follow up this month. Is the current pain similar to her previous issue? At what dose of decreasing this did she see increased symptoms, or is she off completely? We can resume at the previous effective dose until we follow up in that case. If this is a new shingles rash problem she needs to contact her PCP about this.

## 2022-09-15 NOTE — Telephone Encounter (Signed)
Attempted to contact the patient and left a message to call the office back. 

## 2022-09-16 NOTE — Telephone Encounter (Signed)
Patient returned the call and states she is no longer taking prednisone, she completed the prescription approximately 2 weeks ago. Patient sates that the pain she is experiencing feels like it did prior to her new patient visit in February. Patient states she is achy and may be experiencing more muscle pain rather than joint pain. Patient states that the pain returned at a lower dose of prednisone (unable to remember exact dose). The shingles rash is better after completing 10 days of medication from her PCP but she is still very tender in those areas. Patient rescheduled follow up and is now scheduled for 10/07/2022.

## 2022-09-16 NOTE — Telephone Encounter (Signed)
Attempted to contact the patient and left a message to call the office back. 

## 2022-09-20 ENCOUNTER — Ambulatory Visit (HOSPITAL_COMMUNITY): Payer: Medicare Other

## 2022-09-20 MED ORDER — PREDNISONE 5 MG PO TABS: 5.0000 mg | ORAL_TABLET | Freq: Every day | ORAL | 0 refills | Status: AC

## 2022-09-20 NOTE — Addendum Note (Signed)
Addended by: Fuller Plan on: 09/20/2022 07:47 AM   Modules accepted: Orders

## 2022-09-20 NOTE — Telephone Encounter (Signed)
Patient advised Dr. Dimple Casey Sent new Rx for prednisone 5 mg daily for next 2 weeks for increased symptoms. Can discuss longer term plan at her follow up visit with Dr. Dimple Casey. Patient verbalized understanding.

## 2022-09-20 NOTE — Telephone Encounter (Signed)
Sent new Rx for prednisone 5 mg daily for next 2 weeks for increased symptoms. We can discuss longer term plan at our follow up.

## 2022-10-07 ENCOUNTER — Encounter: Payer: Self-pay | Admitting: Internal Medicine

## 2022-10-07 ENCOUNTER — Ambulatory Visit: Payer: Medicare Other | Attending: Internal Medicine | Admitting: Internal Medicine

## 2022-10-07 VITALS — BP 144/81 | HR 78 | Resp 16 | Ht 66.0 in | Wt 158.8 lb

## 2022-10-07 DIAGNOSIS — M25551 Pain in right hip: Secondary | ICD-10-CM

## 2022-10-07 DIAGNOSIS — R768 Other specified abnormal immunological findings in serum: Secondary | ICD-10-CM

## 2022-10-07 DIAGNOSIS — M25552 Pain in left hip: Secondary | ICD-10-CM | POA: Diagnosis not present

## 2022-10-07 NOTE — Progress Notes (Signed)
Office Visit Note  Patient: Krystal Morris             Date of Birth: [birth year]           MRN: [id number]             PCP: Catha Nottingham, FNP Referring: Catha Nottingham, FNP Visit Date: 10/07/2022   Subjective:  Other (Patient reports pain in bilateral legs, no change since previous visit.)   History of Present Illness: Krystal Morris is a 72 y.o. female here for follow up for joint pain in multiple areas with positive ANA tapered down prednisone after our initial visit in February.  Since decreasing medication she still has ongoing pain particularly in both upper legs these may be partially worse since decreasing prednisone but never had resolution of symptoms even at the higher dose.  Especially gets pain around the low back lateral hip and into the thigh on both sides.  Also feels generally fatigued.  Previous HPI 07/22/22 Krystal Morris is a 72 y.o. female here for evaluation of positive ANA associated with fatigue and body aches.  Problems are noticeable since at least the past year.  She recalls proceeding changes included COVID vaccines and starting atorvastatin but she discontinued the medicine after a short time.  Lab testing for the symptoms about a year ago showed positive ANA with highly positive RNP titer otherwise negative inflammatory markers.  She started on daily oral prednisone since June last year initially at 10 mg and 20 mg dose.  She initially noticed weight gain which was an improvement due to her chronically poor appetite but lost the weight again within the past few months.  She tried taking herself off the prednisone but felt an increase in generalized body aches and stiffness shortly when she stopped this.  In the past few weeks worse trouble is in her anterior thighs and low back and hip most noticeable while she is working which involves prolonged standing and walking. Besides the body aches she has not noticed any new skin rashes, visible joint swelling,  photosensitivity, alopecia, or Raynaud's symptoms.  No history of abnormal bleeding or blood clots. She has some chronic constipation no history of inflammatory bowel disease.  Previously had evaluation for severe GERD and painful swallowing possibly contributing with chronic poor appetite and difficulty maintaining weight.   Review of Systems  Constitutional:  Positive for fatigue.  HENT:  Positive for mouth sores. Negative for mouth dryness.   Eyes:  Negative for dryness.  Respiratory:  Negative for shortness of breath.   Cardiovascular:  Negative for chest pain and palpitations.  Gastrointestinal:  Positive for constipation and diarrhea. Negative for blood in stool.  Endocrine: Negative for increased urination.  Genitourinary:  Negative for involuntary urination.  Musculoskeletal:  Positive for joint pain, joint pain, myalgias, morning stiffness, muscle tenderness and myalgias. Negative for gait problem, joint swelling and muscle weakness.  Skin:  Negative for color change, rash, hair loss and sensitivity to sunlight.  Allergic/Immunologic: Negative for susceptible to infections.  Neurological:  Positive for headaches. Negative for dizziness.  Hematological:  Negative for swollen glands.  Psychiatric/Behavioral:  Positive for depressed mood and sleep disturbance. The patient is nervous/anxious.     PMFS History:  Patient Active Problem List   Diagnosis Date Noted   Bilateral hip pain 10/07/2022   Positive ANA (antinuclear antibody) 07/22/2022   Body aches 07/22/2022   Acute metabolic encephalopathy 02/09/2021   Hypertension complicating diabetes (HCC) 02/09/2021  Bladder wall thickening 05/07/2020   Anal or rectal pain    Abdominal pain 07/26/2019   RLQ abdominal pain 05/02/2019   Left buttock pain 05/02/2019   Rectal bleeding 01/17/2018   Poor appetite 01/17/2018   Abdominal pain, epigastric 01/02/2015   Odynophagia 01/02/2015   GAD (generalized anxiety disorder) 04/03/2013    Internal hemorrhoids with complication 12/27/2011   IBS 11/18/2009   HIP PAIN, LEFT 09/24/2008   GERD, SEVERE 08/22/2008    Past Medical History:  Diagnosis Date   Anxiety    Diverticulosis    Mild AC simple adenoma small interal hemorrhoids moderate-sized HH mild gastritis and duodenitis   GERD (gastroesophageal reflux disease)    Epigastric pain   Irritable bowel syndrome    Ulcer     Family History  Problem Relation Age of Onset   COPD Mother    COPD Father    Drug abuse Sister        medication overdose   High blood pressure Sister    High blood pressure Sister    High blood pressure Sister    Colon polyps Daughter    Cancer Son    Coronary artery disease Son    Past Surgical History:  Procedure Laterality Date   ABDOMINAL HYSTERECTOMY     BIOPSY  03/24/2020   Procedure: BIOPSY;  Surgeon: Lanelle Bal, DO;  Location: [facility];  Service: Endoscopy;;   CARDIAC SURGERY     CATARACT EXTRACTION W/PHACO Right 05/01/2018   Procedure: CATARACT EXTRACTION PHACO AND INTRAOCULAR LENS PLACEMENT (IOC);  Surgeon: Gemma Payor, MD;  Location: [facility];  Service: Ophthalmology;  Laterality: Right;  CDE: 11.14   CATARACT EXTRACTION W/PHACO Left 05/15/2018   Procedure: CATARACT EXTRACTION PHACO AND INTRAOCULAR LENS PLACEMENT (IOC);  Surgeon: Gemma Payor, MD;  Location: [facility];  Service: Ophthalmology;  Laterality: Left;  CDE: 12.79   COLONOSCOPY  02/12/2008   Fields-Simple adenoma. Small int hemorrhoids   COLONOSCOPY  01/28/2012   Formed stool in MID-TRANSVERSE colon Mild diverticulosis was noted in the sigmoid colon.  , and Moderate sized hemorrhoids were found   COLONOSCOPY  02/22/2012   Three sessile polyps ranging between 3-82mm in size were found in the descending colon (tubular adenomas); polypectomy was performed/Mild diverticulosis was noted in the sigmoid colon Moderate sized internal hemorrhoids-CAUSING RECTAL BLEEDING-S/P HEMORRHOID BANDINGx2. recommended 10 year f/u tcs    COLONOSCOPY WITH PROPOFOL N/A 03/24/2020   Non-bleeding internal hemorrhoids. Multiple diverticula. Three sessile polyps 5-8 mm in size in ascendign colon. 1 mm polyp in ascending colon that was sessile. Tubular adenomas. Negative colonic biopsies. 5 year surveillance.    ESOPHAGOGASTRODUODENOSCOPY  02/12/2008   Fields- Nl duodenum/chronic active gastritis   FLEXIBLE SIGMOIDOSCOPY N/A 08/13/2019   moderate external hemorrhoids, small internal hemorrhoids, moderate diverticulosis in rectosigmoid and sigmoid colon.    PARTIAL HYSTERECTOMY     POLYPECTOMY  03/24/2020   Procedure: POLYPECTOMY;  Surgeon: Lanelle Bal, DO;  Location: [facility];  Service: Endoscopy;;   Thyroid Growth     Removal   TUBAL LIGATION     UMBILICAL HERNIA REPAIR     Social History   Social History Narrative   Lost 1 son, 3 living    There is no immunization history on file for this patient.   Objective: Vital Signs: BP (!) 144/81 (BP Location: Left Arm, Patient Position: Sitting, Cuff Size: Normal)   Pulse 78   Resp 16   Ht 5\' 6"  (1.676 m)   Wt  158 lb 12.8 oz (72 kg)   BMI 25.63 kg/m    Physical Exam Cardiovascular:     Rate and Rhythm: Normal rate and regular rhythm.  Pulmonary:     Effort: Pulmonary effort is normal.     Breath sounds: Normal breath sounds.  Skin:    General: Skin is warm and dry.  Neurological:     Mental Status: She is alert.  Psychiatric:        Mood and Affect: Mood normal.      Musculoskeletal Exam:  Shoulders full ROM no tenderness or swelling Elbows full ROM no tenderness or swelling Wrists full ROM no tenderness or swelling Fingers full ROM no tenderness or swelling, mild 1st CMC joint squaring, 2nd DIP bony nodules No paraspinal tenderness to palpation over upper and lower back Mild lateral hip tenderness without radiation, does not localized to single point, not provoked with rotation Knees full ROM no tenderness or swelling    Investigation: No  additional findings.  Imaging: No results found.  Recent Labs: Lab Results  Component Value Date   WBC 4.2 02/09/2021   HGB 12.3 02/09/2021   PLT 201 02/09/2021   NA 141 02/09/2021   K 4.0 02/09/2021   CL 109 02/09/2021   CO2 23 02/09/2021   GLUCOSE 89 02/09/2021   BUN 14 02/09/2021   CREATININE 0.81 02/09/2021   BILITOT 0.7 02/09/2021   ALKPHOS 59 02/09/2021   AST 21 02/09/2021   ALT 14 02/09/2021   PROT 6.3 (L) 02/09/2021   ALBUMIN 3.6 02/09/2021   CALCIUM 8.8 (L) 02/09/2021   GFRAA 87 02/11/2020    Speciality Comments: No specialty comments available.  Procedures:  No procedures performed Allergies: Penicillins   Assessment / Plan:     Visit Diagnoses: Bilateral hip pain Positive ANA (antinuclear antibody) - Plan: Sedimentation rate, C-reactive protein  Still do not see any specific clinical criteria peripheral joint inflammation to correlate with the positive ANA.  Rechecking serum inflammatory markers today.  If abnormal recommend more gradual prednisone tapering would not recommend any specific antirheumatic DMARD treatment at this time.  If results look normal could be related to more structural cause. 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  I do not think there is severe hip osteoarthritis. Could try treatment with lateral hip steroid injection or physical therapy. If failure to improve would need Ortho/spine specialist evaluation.  Orders: Orders Placed This Encounter  Procedures   Sedimentation rate   C-reactive protein   No orders of the defined types were placed in this encounter.    Follow-Up Instructions: Return in about 3 months (around 01/07/2023) for ?ANA/PMR on GC f/u 3mos.   Fuller Plan, MD  Note - This record has been created using [employer].  Chart creation errors have been sought, but may not always  have been located. Such creation errors do not reflect on  the  standard of medical care.

## 2022-10-08 LAB — SEDIMENTATION RATE: Sed Rate: 2 mm/h (ref 0–30)

## 2022-10-08 LAB — C-REACTIVE PROTEIN: CRP: <3 mg/L (ref <8.0)

## 2022-10-15 ENCOUNTER — Ambulatory Visit: Payer: Medicare Other | Admitting: Internal Medicine

## 2022-11-10 NOTE — Progress Notes (Unsigned)
Office Visit Note  Patient: Krystal Morris             Date of Birth: [birth year]           MRN: [id number]             PCP: Catha Nottingham, FNP Referring: Catha Nottingham, FNP Visit Date: 11/11/2022   Subjective:  No chief complaint on file.   History of Present Illness: Krystal Morris is a 72 y.o. female here for follow up ***   Previous HPI 10/07/22 Krystal Morris is a 72 y.o. female here for follow up for joint pain in multiple areas with positive ANA tapered down prednisone after our initial visit in February.  Since decreasing medication she still has ongoing pain particularly in both upper legs these may be partially worse since decreasing prednisone but never had resolution of symptoms even at the higher dose.  Especially gets pain around the low back lateral hip and into the thigh on both sides.  Also feels generally fatigued.   Previous HPI 07/22/22 Krystal Morris is a 72 y.o. female here for evaluation of positive ANA associated with fatigue and body aches.  Problems are noticeable since at least the past year.  She recalls proceeding changes included COVID vaccines and starting atorvastatin but she discontinued the medicine after a short time.  Lab testing for the symptoms about a year ago showed positive ANA with highly positive RNP titer otherwise negative inflammatory markers.  She started on daily oral prednisone since June last year initially at 10 mg and 20 mg dose.  She initially noticed weight gain which was an improvement due to her chronically poor appetite but lost the weight again within the past few months.  She tried taking herself off the prednisone but felt an increase in generalized body aches and stiffness shortly when she stopped this.  In the past few weeks worse trouble is in her anterior thighs and low back and hip most noticeable while she is working which involves prolonged standing and walking. Besides the body aches she has not noticed any new skin  rashes, visible joint swelling, photosensitivity, alopecia, or Raynaud's symptoms.  No history of abnormal bleeding or blood clots. She has some chronic constipation no history of inflammatory bowel disease.  Previously had evaluation for severe GERD and painful swallowing possibly contributing with chronic poor appetite and difficulty maintaining weight.   No Rheumatology ROS completed.   PMFS History:  Patient Active Problem List   Diagnosis Date Noted   Bilateral hip pain 10/07/2022   Positive ANA (antinuclear antibody) 07/22/2022   Body aches 07/22/2022   Acute metabolic encephalopathy 02/09/2021   Hypertension complicating diabetes (HCC) 02/09/2021   Bladder wall thickening 05/07/2020   Anal or rectal pain    Abdominal pain 07/26/2019   RLQ abdominal pain 05/02/2019   Left buttock pain 05/02/2019   Rectal bleeding 01/17/2018   Poor appetite 01/17/2018   Abdominal pain, epigastric 01/02/2015   Odynophagia 01/02/2015   GAD (generalized anxiety disorder) 04/03/2013   Internal hemorrhoids with complication 12/27/2011   IBS 11/18/2009   HIP PAIN, LEFT 09/24/2008   GERD, SEVERE 08/22/2008    Past Medical History:  Diagnosis Date   Anxiety    Diverticulosis    Mild AC simple adenoma small interal hemorrhoids moderate-sized HH mild gastritis and duodenitis   GERD (gastroesophageal reflux disease)    Epigastric pain   Irritable bowel syndrome    Ulcer  Family History  Problem Relation Age of Onset   COPD Mother    COPD Father    Drug abuse Sister        medication overdose   High blood pressure Sister    High blood pressure Sister    High blood pressure Sister    Colon polyps Daughter    Cancer Son    Coronary artery disease Son    Past Surgical History:  Procedure Laterality Date   ABDOMINAL HYSTERECTOMY     BIOPSY  03/24/2020   Procedure: BIOPSY;  Surgeon: Lanelle Bal, DO;  Location: [facility];  Service: Endoscopy;;   CARDIAC SURGERY     CATARACT  EXTRACTION W/PHACO Right 05/01/2018   Procedure: CATARACT EXTRACTION PHACO AND INTRAOCULAR LENS PLACEMENT (IOC);  Surgeon: Gemma Payor, MD;  Location: [facility];  Service: Ophthalmology;  Laterality: Right;  CDE: 11.14   CATARACT EXTRACTION W/PHACO Left 05/15/2018   Procedure: CATARACT EXTRACTION PHACO AND INTRAOCULAR LENS PLACEMENT (IOC);  Surgeon: Gemma Payor, MD;  Location: [facility];  Service: Ophthalmology;  Laterality: Left;  CDE: 12.79   COLONOSCOPY  02/12/2008   Fields-Simple adenoma. Small int hemorrhoids   COLONOSCOPY  01/28/2012   Formed stool in MID-TRANSVERSE colon Mild diverticulosis was noted in the sigmoid colon.  , and Moderate sized hemorrhoids were found   COLONOSCOPY  02/22/2012   Three sessile polyps ranging between 3-24mm in size were found in the descending colon (tubular adenomas); polypectomy was performed/Mild diverticulosis was noted in the sigmoid colon Moderate sized internal hemorrhoids-CAUSING RECTAL BLEEDING-S/P HEMORRHOID BANDINGx2. recommended 10 year f/u tcs   COLONOSCOPY WITH PROPOFOL N/A 03/24/2020   Non-bleeding internal hemorrhoids. Multiple diverticula. Three sessile polyps 5-8 mm in size in ascendign colon. 1 mm polyp in ascending colon that was sessile. Tubular adenomas. Negative colonic biopsies. 5 year surveillance.    ESOPHAGOGASTRODUODENOSCOPY  02/12/2008   Fields- Nl duodenum/chronic active gastritis   FLEXIBLE SIGMOIDOSCOPY N/A 08/13/2019   moderate external hemorrhoids, small internal hemorrhoids, moderate diverticulosis in rectosigmoid and sigmoid colon.    PARTIAL HYSTERECTOMY     POLYPECTOMY  03/24/2020   Procedure: POLYPECTOMY;  Surgeon: Lanelle Bal, DO;  Location: [facility];  Service: Endoscopy;;   Thyroid Growth     Removal   TUBAL LIGATION     UMBILICAL HERNIA REPAIR     Social History   Social History Narrative   Lost 1 son, 3 living    There is no immunization history on file for this patient.   Objective: Vital Signs: There were  no vitals taken for this visit.   Physical Exam   Musculoskeletal Exam: ***  CDAI Exam: CDAI Score: -- Patient Global: --; Provider Global: -- Swollen: --; Tender: -- Joint Exam 11/11/2022   No joint exam has been documented for this visit   There is currently no information documented on the homunculus. Go to the Rheumatology activity and complete the homunculus joint exam.  Investigation: No additional findings.  Imaging: No results found.  Recent Labs: Lab Results  Component Value Date   WBC 4.2 02/09/2021   HGB 12.3 02/09/2021   PLT 201 02/09/2021   NA 141 02/09/2021   K 4.0 02/09/2021   CL 109 02/09/2021   CO2 23 02/09/2021   GLUCOSE 89 02/09/2021   BUN 14 02/09/2021   CREATININE 0.81 02/09/2021   BILITOT 0.7 02/09/2021   ALKPHOS 59 02/09/2021   AST 21 02/09/2021   ALT 14 02/09/2021   PROT 6.3 (L) 02/09/2021  ALBUMIN 3.6 02/09/2021   CALCIUM 8.8 (L) 02/09/2021   GFRAA 87 02/11/2020    Speciality Comments: No specialty comments available.  Procedures:  No procedures performed Allergies: Penicillins   Assessment / Plan:     Visit Diagnoses: No diagnosis found.  ***  Orders: No orders of the defined types were placed in this encounter.  No orders of the defined types were placed in this encounter.    Follow-Up Instructions: No follow-ups on file.   Fuller Plan, MD  Note - This record has been created using [employer].  Chart creation errors have been sought, but may not always  have been located. Such creation errors do not reflect on  the standard of medical care.

## 2022-11-11 ENCOUNTER — Encounter: Payer: Self-pay | Admitting: Internal Medicine

## 2022-11-11 ENCOUNTER — Ambulatory Visit: Payer: Medicare Other | Attending: Internal Medicine | Admitting: Internal Medicine

## 2022-11-11 VITALS — BP 127/67 | HR 89 | Resp 14 | Ht 67.0 in | Wt 160.0 lb

## 2022-11-11 DIAGNOSIS — M25552 Pain in left hip: Secondary | ICD-10-CM | POA: Diagnosis not present

## 2022-11-11 DIAGNOSIS — M25551 Pain in right hip: Secondary | ICD-10-CM | POA: Diagnosis not present

## 2022-11-11 MED ORDER — LIDOCAINE HCL 1 % IJ SOLN
3.0000 mL | INTRAMUSCULAR | Status: AC | PRN
Start: 2022-11-11 — End: 2022-11-11
  Administered 2022-11-11: 3 mL

## 2022-11-11 MED ORDER — TRIAMCINOLONE ACETONIDE 40 MG/ML IJ SUSP
40.0000 mg | INTRAMUSCULAR | Status: AC | PRN
Start: 2022-11-11 — End: 2022-11-11
  Administered 2022-11-11: 40 mg via INTRA_ARTICULAR

## 2022-12-24 NOTE — Progress Notes (Deleted)
Office Visit Note  Patient: Krystal Morris             Date of Birth: 11-19-1950           MRN: [id number]             PCP: Catha Nottingham, FNP Referring: Catha Nottingham, FNP Visit Date: 01/07/2023   Subjective:  No chief complaint on file.   History of Present Illness: Krystal Morris is a 72 y.o. female here for follow up for joint pain currently worst problem at the left hip with pain on the side and back radiating down the leg.    Previous HPI 11/11/2022 Krystal Morris is a 72 y.o. female here for follow up for joint pain currently worst problem at the left hip with pain on the side and back radiating down the leg. Symptoms most often provoked with standing after a prolonged time sitting in one position. Sometimes goes all the way to her foot other times just down to the side of the knee. No associated swelling. Very fatigued today, although thinks some related to shift work change currently working 7pm-7am.    Previous HPI 10/07/22 Krystal Morris is a 72 y.o. female here for follow up for joint pain in multiple areas with positive ANA tapered down prednisone after our initial visit in February.  Since decreasing medication she still has ongoing pain particularly in both upper legs these may be partially worse since decreasing prednisone but never had resolution of symptoms even at the higher dose.  Especially gets pain around the low back lateral hip and into the thigh on both sides.  Also feels generally fatigued.   Previous HPI 07/22/22 Krystal Morris is a 72 y.o. female here for evaluation of positive ANA associated with fatigue and body aches.  Problems are noticeable since at least the past year.  She recalls proceeding changes included COVID vaccines and starting atorvastatin but she discontinued the medicine after a short time.  Lab testing for the symptoms about a year ago showed positive ANA with highly positive RNP titer otherwise negative inflammatory markers.  She  started on daily oral prednisone since June last year initially at 10 mg and 20 mg dose.  She initially noticed weight gain which was an improvement due to her chronically poor appetite but lost the weight again within the past few months.  She tried taking herself off the prednisone but felt an increase in generalized body aches and stiffness shortly when she stopped this.  In the past few weeks worse trouble is in her anterior thighs and low back and hip most noticeable while she is working which involves prolonged standing and walking. Besides the body aches she has not noticed any new skin rashes, visible joint swelling, photosensitivity, alopecia, or Raynaud's symptoms.  No history of abnormal bleeding or blood clots. She has some chronic constipation no history of inflammatory bowel disease.  Previously had evaluation for severe GERD and painful swallowing possibly contributing with chronic poor appetite and difficulty maintaining weight.   No Rheumatology ROS completed.   PMFS History:  Patient Active Problem List   Diagnosis Date Noted   Bilateral hip pain 10/07/2022   Positive ANA (antinuclear antibody) 07/22/2022   Body aches 07/22/2022   Acute metabolic encephalopathy 02/09/2021   Hypertension complicating diabetes (HCC) 02/09/2021   Bladder wall thickening 05/07/2020   Anal or rectal pain    Abdominal pain 07/26/2019   RLQ abdominal pain 05/02/2019  Left buttock pain 05/02/2019   Rectal bleeding 01/17/2018   Poor appetite 01/17/2018   Abdominal pain, epigastric 01/02/2015   Odynophagia 01/02/2015   GAD (generalized anxiety disorder) 04/03/2013   Internal hemorrhoids with complication 12/27/2011   IBS 11/18/2009   HIP PAIN, LEFT 09/24/2008   GERD, SEVERE 08/22/2008    Past Medical History:  Diagnosis Date   Anxiety    Diverticulosis    Mild AC simple adenoma small interal hemorrhoids moderate-sized HH mild gastritis and duodenitis   GERD (gastroesophageal reflux  disease)    Epigastric pain   Irritable bowel syndrome    Ulcer     Family History  Problem Relation Age of Onset   COPD Mother    COPD Father    Drug abuse Sister        medication overdose   High blood pressure Sister    High blood pressure Sister    High blood pressure Sister    Colon polyps Daughter    Cancer Son    Coronary artery disease Son    Past Surgical History:  Procedure Laterality Date   ABDOMINAL HYSTERECTOMY     BIOPSY  03/24/2020   Procedure: BIOPSY;  Surgeon: Lanelle Bal, DO;  Location: [facility];  Service: Endoscopy;;   CARDIAC SURGERY     CATARACT EXTRACTION W/PHACO Right 05/01/2018   Procedure: CATARACT EXTRACTION PHACO AND INTRAOCULAR LENS PLACEMENT (IOC);  Surgeon: Gemma Payor, MD;  Location: [facility];  Service: Ophthalmology;  Laterality: Right;  CDE: 11.14   CATARACT EXTRACTION W/PHACO Left 05/15/2018   Procedure: CATARACT EXTRACTION PHACO AND INTRAOCULAR LENS PLACEMENT (IOC);  Surgeon: Gemma Payor, MD;  Location: [facility];  Service: Ophthalmology;  Laterality: Left;  CDE: 12.79   COLONOSCOPY  02/12/2008   Fields-Simple adenoma. Small int hemorrhoids   COLONOSCOPY  01/28/2012   Formed stool in MID-TRANSVERSE colon Mild diverticulosis was noted in the sigmoid colon.  , and Moderate sized hemorrhoids were found   COLONOSCOPY  02/22/2012   Three sessile polyps ranging between 3-2mm in size were found in the descending colon (tubular adenomas); polypectomy was performed/Mild diverticulosis was noted in the sigmoid colon Moderate sized internal hemorrhoids-CAUSING RECTAL BLEEDING-S/P HEMORRHOID BANDINGx2. recommended 10 year f/u tcs   COLONOSCOPY WITH PROPOFOL N/A 03/24/2020   Non-bleeding internal hemorrhoids. Multiple diverticula. Three sessile polyps 5-8 mm in size in ascendign colon. 1 mm polyp in ascending colon that was sessile. Tubular adenomas. Negative colonic biopsies. 5 year surveillance.    ESOPHAGOGASTRODUODENOSCOPY  02/12/2008   Fields- Nl  duodenum/chronic active gastritis   FLEXIBLE SIGMOIDOSCOPY N/A 08/13/2019   moderate external hemorrhoids, small internal hemorrhoids, moderate diverticulosis in rectosigmoid and sigmoid colon.    PARTIAL HYSTERECTOMY     POLYPECTOMY  03/24/2020   Procedure: POLYPECTOMY;  Surgeon: Lanelle Bal, DO;  Location: [facility];  Service: Endoscopy;;   Thyroid Growth     Removal   TUBAL LIGATION     UMBILICAL HERNIA REPAIR     Social History   Social History Narrative   Lost 1 son, 3 living    There is no immunization history on file for this patient.   Objective: Vital Signs: There were no vitals taken for this visit.   Physical Exam   Musculoskeletal Exam: ***  CDAI Exam: CDAI Score: -- Patient Global: --; Provider Global: -- Swollen: --; Tender: -- Joint Exam 01/07/2023   No joint exam has been documented for this visit   There is currently no information documented  on the homunculus. Go to the Rheumatology activity and complete the homunculus joint exam.  Investigation: No additional findings.  Imaging: No results found.  Recent Labs: Lab Results  Component Value Date   WBC 4.2 02/09/2021   HGB 12.3 02/09/2021   PLT 201 02/09/2021   NA 141 02/09/2021   K 4.0 02/09/2021   CL 109 02/09/2021   CO2 23 02/09/2021   GLUCOSE 89 02/09/2021   BUN 14 02/09/2021   CREATININE 0.81 02/09/2021   BILITOT 0.7 02/09/2021   ALKPHOS 59 02/09/2021   AST 21 02/09/2021   ALT 14 02/09/2021   PROT 6.3 (L) 02/09/2021   ALBUMIN 3.6 02/09/2021   CALCIUM 8.8 (L) 02/09/2021   GFRAA 87 02/11/2020    Speciality Comments: No specialty comments available.  Procedures:  No procedures performed Allergies: Penicillins   Assessment / Plan:     Visit Diagnoses: No diagnosis found.  ***  Orders: No orders of the defined types were placed in this encounter.  No orders of the defined types were placed in this encounter.    Follow-Up Instructions: No follow-ups on  file.   Metta Clines, RT  Note - This record has been created using [employer].  Chart creation errors have been sought, but may not always  have been located. Such creation errors do not reflect on  the standard of medical care.

## 2023-01-07 ENCOUNTER — Ambulatory Visit: Payer: Medicare Other | Admitting: Internal Medicine

## 2023-01-07 DIAGNOSIS — M25551 Pain in right hip: Secondary | ICD-10-CM

## 2023-05-11 ENCOUNTER — Encounter (HOSPITAL_COMMUNITY): Payer: Self-pay | Admitting: Nurse Practitioner

## 2023-05-11 ENCOUNTER — Other Ambulatory Visit (HOSPITAL_COMMUNITY): Payer: Self-pay | Admitting: Nurse Practitioner

## 2023-05-11 DIAGNOSIS — Z1231 Encounter for screening mammogram for malignant neoplasm of breast: Secondary | ICD-10-CM

## 2023-07-18 ENCOUNTER — Ambulatory Visit (HOSPITAL_COMMUNITY)
Admission: RE | Admit: 2023-07-18 | Discharge: 2023-07-18 | Disposition: A | Discharge status: Home / Self Care | Payer: Medicare Other | Source: Ambulatory Visit | Attending: Nurse Practitioner | Admitting: Nurse Practitioner

## 2023-07-18 DIAGNOSIS — Z1231 Encounter for screening mammogram for malignant neoplasm of breast: Secondary | ICD-10-CM | POA: Insufficient documentation

## 2023-07-22 ENCOUNTER — Other Ambulatory Visit (HOSPITAL_COMMUNITY): Payer: Self-pay | Admitting: Nurse Practitioner

## 2023-07-22 DIAGNOSIS — M8589 Other specified disorders of bone density and structure, multiple sites: Secondary | ICD-10-CM

## 2023-08-01 ENCOUNTER — Other Ambulatory Visit (HOSPITAL_COMMUNITY): Payer: Medicare Other

## 2023-08-29 ENCOUNTER — Other Ambulatory Visit (HOSPITAL_COMMUNITY): Payer: Self-pay | Admitting: Nurse Practitioner

## 2023-08-29 ENCOUNTER — Ambulatory Visit (HOSPITAL_COMMUNITY)
Admission: RE | Admit: 2023-08-29 | Discharge: 2023-08-29 | Disposition: A | Discharge status: Home / Self Care | Source: Ambulatory Visit | Attending: Nurse Practitioner | Admitting: Nurse Practitioner

## 2023-08-29 DIAGNOSIS — M8589 Other specified disorders of bone density and structure, multiple sites: Secondary | ICD-10-CM | POA: Diagnosis present

## 2023-08-29 DIAGNOSIS — M25532 Pain in left wrist: Secondary | ICD-10-CM

## 2023-10-03 ENCOUNTER — Other Ambulatory Visit (HOSPITAL_COMMUNITY): Payer: Self-pay | Admitting: Nurse Practitioner

## 2023-10-03 DIAGNOSIS — M8589 Other specified disorders of bone density and structure, multiple sites: Secondary | ICD-10-CM
